# Patient Record
Sex: Female | Born: 1955 | Race: White | Hispanic: No | Marital: Married | Smoking: Never smoker
Health system: Southern US, Community
[De-identification: ages and names within clinical notes are randomized; demographics above are authoritative.]

## PROBLEM LIST (undated history)

## (undated) DIAGNOSIS — I82409 Acute embolism and thrombosis of unspecified deep veins of unspecified lower extremity: Secondary | ICD-10-CM

## (undated) DIAGNOSIS — Z794 Long term (current) use of insulin: Secondary | ICD-10-CM

## (undated) DIAGNOSIS — F410 Panic disorder [episodic paroxysmal anxiety] without agoraphobia: Secondary | ICD-10-CM

## (undated) DIAGNOSIS — E119 Type 2 diabetes mellitus without complications: Secondary | ICD-10-CM

## (undated) DIAGNOSIS — L719 Rosacea, unspecified: Secondary | ICD-10-CM

## (undated) DIAGNOSIS — N200 Calculus of kidney: Secondary | ICD-10-CM

## (undated) DIAGNOSIS — F419 Anxiety disorder, unspecified: Secondary | ICD-10-CM

## (undated) DIAGNOSIS — D1803 Hemangioma of intra-abdominal structures: Secondary | ICD-10-CM

## (undated) DIAGNOSIS — M1990 Unspecified osteoarthritis, unspecified site: Secondary | ICD-10-CM

## (undated) DIAGNOSIS — K219 Gastro-esophageal reflux disease without esophagitis: Secondary | ICD-10-CM

## (undated) DIAGNOSIS — E039 Hypothyroidism, unspecified: Secondary | ICD-10-CM

## (undated) DIAGNOSIS — J45909 Unspecified asthma, uncomplicated: Secondary | ICD-10-CM

## (undated) DIAGNOSIS — E785 Hyperlipidemia, unspecified: Secondary | ICD-10-CM

## (undated) DIAGNOSIS — I10 Essential (primary) hypertension: Secondary | ICD-10-CM

## (undated) DIAGNOSIS — Z8781 Personal history of (healed) traumatic fracture: Secondary | ICD-10-CM

## (undated) DIAGNOSIS — E781 Pure hyperglyceridemia: Secondary | ICD-10-CM

## (undated) HISTORY — DX: Hyperlipidemia, unspecified: E78.5

## (undated) HISTORY — DX: Hemangioma of intra-abdominal structures: D18.03

## (undated) HISTORY — DX: Calculus of kidney: N20.0

## (undated) HISTORY — DX: Panic disorder (episodic paroxysmal anxiety): F41.0

## (undated) HISTORY — DX: Personal history of (healed) traumatic fracture: Z87.81

## (undated) HISTORY — PX: KIDNEY STONE SURGERY: SHX686

## (undated) HISTORY — DX: Rosacea, unspecified: L71.9

## (undated) HISTORY — DX: Anxiety disorder, unspecified: F41.9

## (undated) HISTORY — DX: Type 2 diabetes mellitus without complications: E11.9

## (undated) HISTORY — DX: Hypothyroidism, unspecified: E03.9

## (undated) HISTORY — DX: Long term (current) use of insulin: Z79.4

## (undated) HISTORY — DX: Unspecified asthma, uncomplicated: J45.909

## (undated) HISTORY — DX: Unspecified osteoarthritis, unspecified site: M19.90

## (undated) HISTORY — DX: Essential (primary) hypertension: I10

## (undated) HISTORY — DX: Gastro-esophageal reflux disease without esophagitis: K21.9

## (undated) HISTORY — DX: Acute embolism and thrombosis of unspecified deep veins of unspecified lower extremity: I82.409

## (undated) HISTORY — DX: Pure hyperglyceridemia: E78.1

## (undated) HISTORY — PX: CATARACT EXTRACTION: SUR2

---

## 1978-04-17 DIAGNOSIS — I82409 Acute embolism and thrombosis of unspecified deep veins of unspecified lower extremity: Secondary | ICD-10-CM

## 1978-04-17 HISTORY — DX: Acute embolism and thrombosis of unspecified deep veins of unspecified lower extremity: I82.409

## 1992-04-17 HISTORY — PX: THYROIDECTOMY, PARTIAL: SHX18

## 1994-04-17 HISTORY — PX: THYROIDECTOMY, PARTIAL: SHX18

## 1996-04-17 HISTORY — PX: COMBINED HYSTEROSCOPY DIAGNOSTIC / D&C: SUR297

## 1997-04-17 HISTORY — PX: INCONTINENCE SURGERY: SHX676

## 1997-09-25 ENCOUNTER — Ambulatory Visit (HOSPITAL_COMMUNITY): Admission: RE | Admit: 1997-09-25 | Discharge: 1997-09-26 | Payer: Self-pay | Admitting: General Surgery

## 1997-10-09 ENCOUNTER — Ambulatory Visit (HOSPITAL_COMMUNITY): Admission: RE | Admit: 1997-10-09 | Discharge: 1997-10-09 | Payer: Self-pay | Admitting: General Surgery

## 1997-11-15 HISTORY — PX: BREAST DUCTAL SYSTEM EXCISION: SHX5242

## 1997-12-04 ENCOUNTER — Ambulatory Visit (HOSPITAL_COMMUNITY): Admission: RE | Admit: 1997-12-04 | Discharge: 1997-12-04 | Payer: Self-pay | Admitting: *Deleted

## 2000-04-17 HISTORY — PX: TOTAL ABDOMINAL HYSTERECTOMY: SHX209

## 2000-09-18 ENCOUNTER — Encounter (INDEPENDENT_AMBULATORY_CARE_PROVIDER_SITE_OTHER): Payer: Self-pay

## 2000-09-18 ENCOUNTER — Inpatient Hospital Stay (HOSPITAL_COMMUNITY): Admission: RE | Admit: 2000-09-18 | Discharge: 2000-09-20 | Payer: Self-pay | Admitting: Obstetrics and Gynecology

## 2000-11-08 ENCOUNTER — Encounter: Admission: RE | Admit: 2000-11-08 | Discharge: 2000-11-08 | Payer: Self-pay | Admitting: General Surgery

## 2000-11-08 ENCOUNTER — Encounter: Payer: Self-pay | Admitting: General Surgery

## 2001-03-17 HISTORY — PX: KNEE ARTHROSCOPY: SUR90

## 2002-03-17 HISTORY — PX: KNEE ARTHROSCOPY: SUR90

## 2002-04-16 ENCOUNTER — Encounter: Admission: RE | Admit: 2002-04-16 | Discharge: 2002-04-16 | Payer: Self-pay | Admitting: Orthopedic Surgery

## 2002-04-16 ENCOUNTER — Encounter: Payer: Self-pay | Admitting: Orthopedic Surgery

## 2002-04-18 ENCOUNTER — Ambulatory Visit (HOSPITAL_BASED_OUTPATIENT_CLINIC_OR_DEPARTMENT_OTHER): Admission: RE | Admit: 2002-04-18 | Discharge: 2002-04-18 | Payer: Self-pay | Admitting: Orthopedic Surgery

## 2002-07-07 ENCOUNTER — Emergency Department (HOSPITAL_COMMUNITY): Admission: EM | Admit: 2002-07-07 | Discharge: 2002-07-07 | Payer: Self-pay | Admitting: Emergency Medicine

## 2002-07-08 ENCOUNTER — Encounter: Payer: Self-pay | Admitting: General Surgery

## 2002-07-08 ENCOUNTER — Encounter (INDEPENDENT_AMBULATORY_CARE_PROVIDER_SITE_OTHER): Payer: Self-pay | Admitting: Specialist

## 2002-07-08 ENCOUNTER — Observation Stay (HOSPITAL_COMMUNITY): Admission: RE | Admit: 2002-07-08 | Discharge: 2002-07-09 | Payer: Self-pay | Admitting: General Surgery

## 2003-04-18 HISTORY — PX: CHOLECYSTECTOMY: SHX55

## 2004-08-23 ENCOUNTER — Ambulatory Visit (HOSPITAL_BASED_OUTPATIENT_CLINIC_OR_DEPARTMENT_OTHER): Admission: RE | Admit: 2004-08-23 | Discharge: 2004-08-23 | Payer: Self-pay | Admitting: Orthopedic Surgery

## 2007-07-19 ENCOUNTER — Ambulatory Visit: Payer: Self-pay | Admitting: Internal Medicine

## 2007-08-21 ENCOUNTER — Ambulatory Visit: Payer: Self-pay | Admitting: Internal Medicine

## 2007-08-21 ENCOUNTER — Encounter: Payer: Self-pay | Admitting: Internal Medicine

## 2007-08-29 ENCOUNTER — Telehealth: Payer: Self-pay | Admitting: Internal Medicine

## 2009-08-15 HISTORY — PX: REPLACEMENT TOTAL KNEE: SUR1224

## 2009-08-27 IMAGING — CR DG CHEST 2V
2 series · 2 of 2 positions shown · non-contrast
Comparison: None.

CLINICAL DATA: Preoperative evaluation for osteoarthritis of the
left knee.  Nonsmoker.  Hypertension and type 2 diabetes, but
controlled medically.

CHEST - 2 VIEW

[w chest pa]
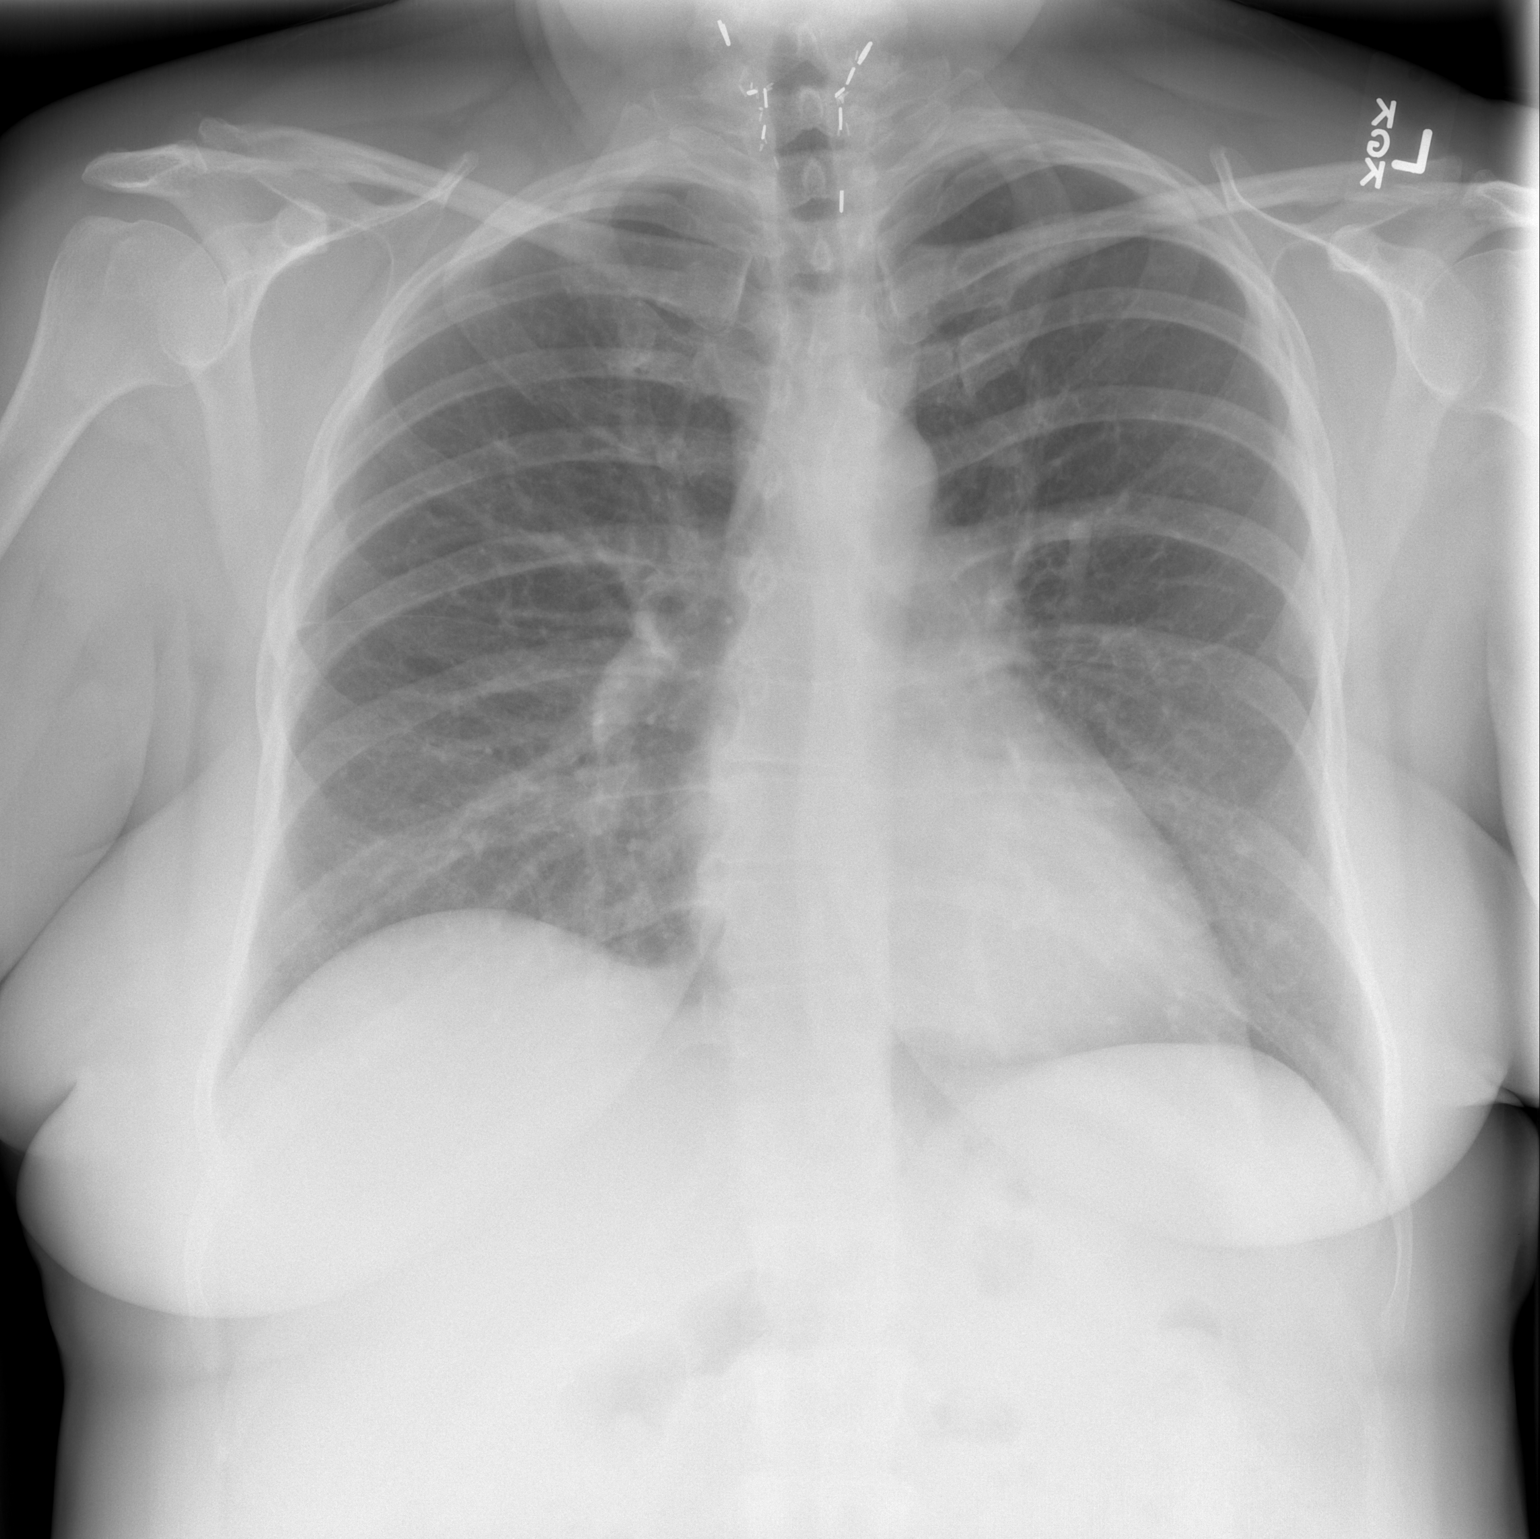

[w chest lat]
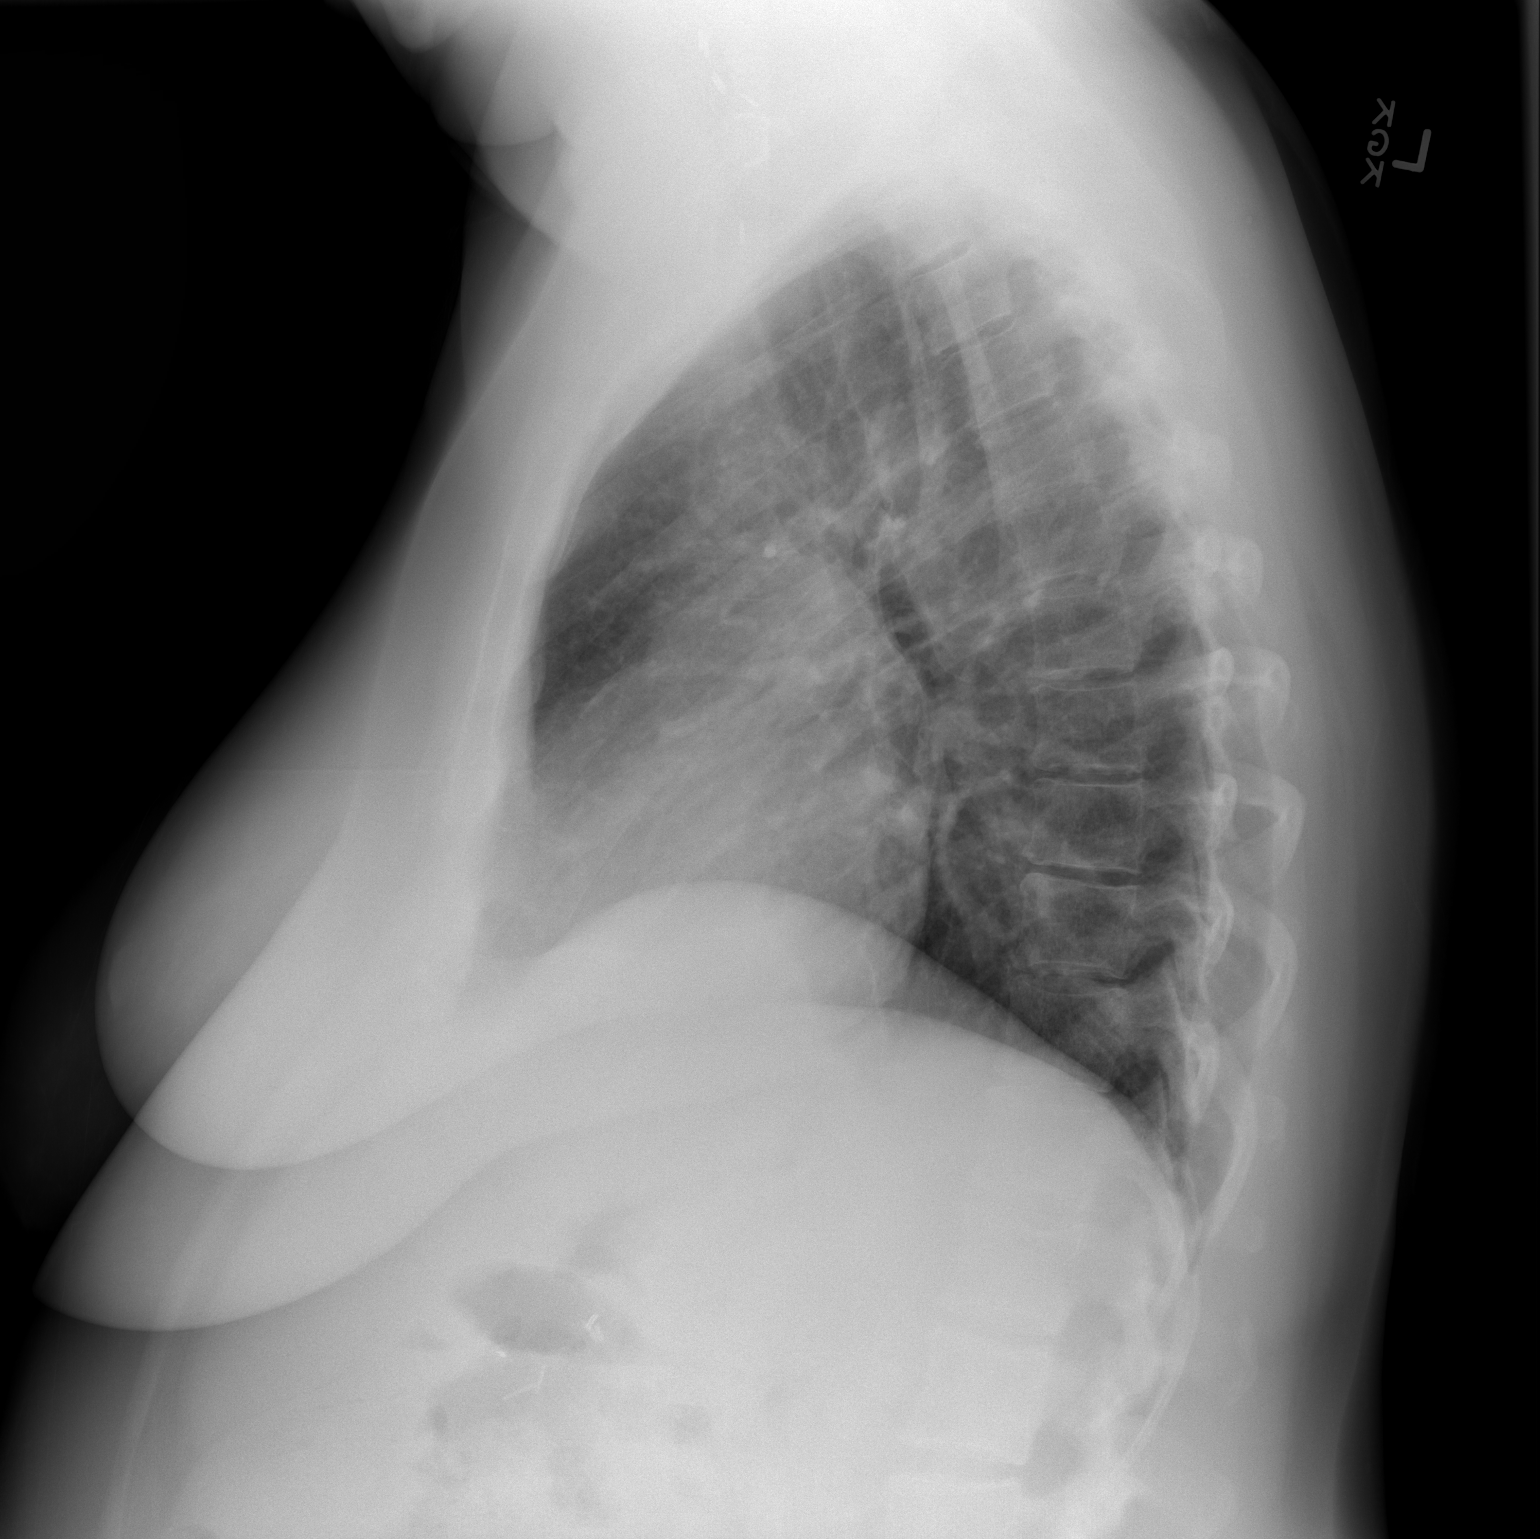

[2 of 2 positions shown; findings below may reference images not displayed]

FINDINGS: Heart and mediastinal contours are within normal limits.
The lung fields are clear with no signs of focal infiltrate or
congestive failure.  No pleural fluid or peribronchial cuffing is
seen.

Bony structures are intact.  Surgical clips are identified in the
right upper quadrant and in the anterior neck.
IMPRESSION: No acute cardiopulmonary abnormality noted.

## 2009-09-02 ENCOUNTER — Inpatient Hospital Stay (HOSPITAL_COMMUNITY): Admission: RE | Admit: 2009-09-02 | Discharge: 2009-09-04 | Payer: Self-pay | Admitting: Orthopedic Surgery

## 2010-07-04 LAB — GLUCOSE, CAPILLARY
Glucose-Capillary: 132 mg/dL — ABNORMAL HIGH (ref 70–99)
Glucose-Capillary: 187 mg/dL — ABNORMAL HIGH (ref 70–99)
Glucose-Capillary: 196 mg/dL — ABNORMAL HIGH (ref 70–99)
Glucose-Capillary: 199 mg/dL — ABNORMAL HIGH (ref 70–99)
Glucose-Capillary: 213 mg/dL — ABNORMAL HIGH (ref 70–99)
Glucose-Capillary: 231 mg/dL — ABNORMAL HIGH (ref 70–99)
Glucose-Capillary: 260 mg/dL — ABNORMAL HIGH (ref 70–99)

## 2010-07-04 LAB — BASIC METABOLIC PANEL
BUN: 11 mg/dL (ref 6–23)
BUN: 9 mg/dL (ref 6–23)
Calcium: 7.9 mg/dL — ABNORMAL LOW (ref 8.4–10.5)
Creatinine, Ser: 0.68 mg/dL (ref 0.4–1.2)
GFR calc non Af Amer: >60 mL/min (ref >60)
GFR calc non Af Amer: >60 mL/min (ref >60)
Glucose, Bld: 238 mg/dL — ABNORMAL HIGH (ref 70–99)
Potassium: 3.5 mEq/L (ref 3.5–5.1)

## 2010-07-04 LAB — CBC
HCT: 30.5 % — ABNORMAL LOW (ref 36.0–46.0)
MCV: 92.6 fL (ref 78.0–100.0)
Platelets: 184 10*3/uL (ref 150–400)
Platelets: 204 10*3/uL (ref 150–400)
RDW: 12.6 % (ref 11.5–15.5)
WBC: 9.1 10*3/uL (ref 4.0–10.5)

## 2010-07-04 LAB — CROSSMATCH
ABO/RH(D): O NEG
Antibody Screen: NEGATIVE

## 2010-07-04 LAB — ABO/RH: ABO/RH(D): O NEG

## 2010-07-05 LAB — URINALYSIS, ROUTINE W REFLEX MICROSCOPIC
Glucose, UA: NEGATIVE mg/dL
Hgb urine dipstick: NEGATIVE
Protein, ur: NEGATIVE mg/dL
Specific Gravity, Urine: 1.02 (ref 1.005–1.030)
pH: 7 (ref 5.0–8.0)

## 2010-07-05 LAB — COMPREHENSIVE METABOLIC PANEL
ALT: 33 U/L (ref 0–35)
AST: 39 U/L — ABNORMAL HIGH (ref 0–37)
Albumin: 4.1 g/dL (ref 3.5–5.2)
CO2: 23 mEq/L (ref 19–32)
Calcium: 9.1 mg/dL (ref 8.4–10.5)
Chloride: 103 mEq/L (ref 96–112)
GFR calc Af Amer: >60 mL/min (ref >60)
GFR calc non Af Amer: >60 mL/min (ref >60)
Sodium: 140 mEq/L (ref 135–145)

## 2010-07-05 LAB — CBC
Platelets: 268 10*3/uL (ref 150–400)
RBC: 4.63 MIL/uL (ref 3.87–5.11)
WBC: 8.7 10*3/uL (ref 4.0–10.5)

## 2010-07-05 LAB — PROTIME-INR: Prothrombin Time: 13.9 seconds (ref 11.6–15.2)

## 2010-07-05 LAB — DIFFERENTIAL
Eosinophils Absolute: 0.2 10*3/uL (ref 0.0–0.7)
Eosinophils Relative: 2 % (ref 0–5)
Lymphs Abs: 3.1 10*3/uL (ref 0.7–4.0)
Monocytes Absolute: 0.7 10*3/uL (ref 0.1–1.0)

## 2010-08-30 NOTE — Assessment & Plan Note (Signed)
[facility]                         GASTROENTEROLOGY OFFICE NOTE   [name], [name]                MRN:          [id number]  DATE:07/19/2007                            DOB:          [birth year]    OFFICE CONSULTATION NOTE   REASON FOR CONSULTATION:  Chronic heartburn and screening colonoscopy.   HISTORY:  This is a pleasant 55 year old white female with a history of  hypertension, type 2 diabetes mellitus, dyslipidemia, asthma, kidney  stones, panic disorder, and chronic reflux disease.  She is referred  through the courtesy of Dr. Timothy Lasso regarding reflux disease and screening  colonoscopy.  The patient reports to me a greater than 15 year history  of daily heartburn.  She has required proton pump inhibitor therapy on a  regular basis for control.  She denies dysphagia.  Her weight has been  stable.  No abdominal pain.  Her bowel habits are regular.  No melena or  hematochezia.   PAST MEDICAL HISTORY:  1. Hypertension.  2. Asthma.  3. Type 2 diabetes mellitus.  4. Dyslipidemia.  5. Kidney stones.  6. Panic disorder.   PAST SURGICAL HISTORY:  1. Status post cholecystectomy.  2. Status post hysterectomy.  3. Status post hemorrhoidectomy.  4. Status post thyroidectomy for benign nodules x2.   FAMILY HISTORY:  Mother with pancreatic cancer.  Sister with peptic  ulcer disease.  Father with prostate cancer.   SOCIAL HISTORY:  Patient is married with one son.  She lives with her  husband.  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  She does not smoke or  use alcohol.   REVIEW OF SYSTEMS:  Per diagnostic evaluation form.   PHYSICAL EXAMINATION:  A well-appearing female in no acute distress.  Blood pressure 110/76, heart rate 84, and regular.  Weight is 206.8  pounds.  She is 5 feet 6 inches in height.  HEENT:  Sclerae are anicteric.  Conjunctivae are pink.  Oral mucosa  is  intact.  There is no adenopathy.  LUNGS:  Clear.  HEART:  Regular.  ABDOMEN:  Soft without tenderness, mass, or hernia.  Good bowel sounds  heard.  EXTREMITIES:  Without edema.   IMPRESSION:  1. Chronic gastroesophageal reflux disease.  No alarm features.  2. Colon cancer screening, baseline risk.  3. Multiple medical problems, including diabetes mellitus.   RECOMMENDATIONS:  1. Colonoscopy with polypectomy if necessary to provide neoplasia      screen.  As well, upper endoscopy to rule out Barrett's esophagus.      The nature of the procedure as well as their risks, benefits and      alternatives were discussed in detail.  She understood and agreed      to proceed.  2. Continue proton pump inhibitor therapy.  3. Reflux precautions.  4. Hold oral diabetic agents the day of her examination in order to      avoid unwanted hypoglycemia.     Wilhemina Bonito. Marina Goodell, MD  Electronically Signed    JNP/MedQ  DD: 07/19/2007  DT: 07/19/2007  Job #: [id number]  cc:   Gwen Pounds, MD

## 2010-09-02 NOTE — Op Note (Signed)
[facility]  Patient:    [name], [name]                MRN: [id number] Proc. Date: 09/18/00 Adm. Date:  [id number] Attending:  Lendon Colonel                           Operative Report  PREOPERATIVE DIAGNOSIS:  Menorrhagia, persistent with anemia.  POSTOPERATIVE DIAGNOSIS: Menorrhagia, persistent with anemia.  OPERATION:  Abdominal hysterectomy.  SURGEON:  Katherine Roan, M.D.  DESCRIPTION OF PROCEDURE: The patient was placed in the lithotomy position, prepped and draped in the usual fashion.  Transverse incision was made in the abdomen and extended in layers to the peritoneal cavity which was entered vertically.  Exploration of the upper abdomen revealed a gallbladder that was nontense, appeared to have a stone in the mid portion of the gallbladder, and it appeared to be just one stone.  Both kidneys were normal.  The was no palpable periaortic adenopathy and no free fluid within the abdomen.  Both ovaries were normal.  The cornual aspects of the uterus were grasped and elevated.  The utero-ovarian ligaments and tube and round ligament were clamped and ligated in one pedicle, suture ligated x 2.  Bladder flap was created.  Uterine vessels were skeletonized and ligated with 0 chromic.  Cardinals and uterosacral ligaments were clamped and ligated with 0 chromic suture.  Angles of the vagina were entered.  The vagina was closed with horizontal mattress sutures of 0 chromic; 0 Vicryl was then used to suture the cardinal and uterosacral complex into the vaginal vault.  The retroperitoneal space was enclosed with interrupted sutures of 3-0 Vicryl.  Hemostasis was secure. Copious amounts of irrigation was used.  Parietal peritoneum was closed with 2-0 PDS and 0 Vicryl.  The subcutaneum was irrigated and found to be hemostatically secure.  Skin was closed with clips.  The incision was then infiltrated with about 20 cc of 0.5% Marcaine with  epinephrine.  [name] tolerated this procedure well and was sent to the recovery room in good condition. DD:  09/18/00 TD:  09/19/00 Job: [id number]

## 2010-09-02 NOTE — Op Note (Signed)
NAME:  EMMAJEAN, RATLEDGE                   ACCOUNT NO.:  [id number]   MEDICAL RECORD NO.:  [id number]                   PATIENT TYPE:  AMB   LOCATION:  DAY                                  FACILITY:  [facility]   PHYSICIAN:  Sharlet Salina T. Hoxworth, M.D.          DATE OF BIRTH:  [birth year]   DATE OF PROCEDURE:  07/08/2002  DATE OF DISCHARGE:                                 OPERATIVE REPORT   PREOPERATIVE DIAGNOSES:  Cholelithiasis and cholecystitis.   POSTOPERATIVE DIAGNOSES:  Cholelithiasis and cholecystitis.   PROCEDURE:  Laparoscopic cholecystectomy with intraoperative cholangiogram.   SURGEON:  Sharlet Salina T. Hoxworth, M.D.   ANESTHESIA:  General.   BRIEF HISTORY:  Lada Fulbright is a 55 year old white female who two years  ago had a brief episode of right upper quadrant abdominal pain and  ultrasound at that time showed small gallstones. She has done well since  that time but now presents with onset two days ago of right upper quadrant  abdominal pain and persistent nausea. She was evaluated by her family  physician and a HIDA scan was obtained which showed very minimal emptying of  the gallbladder. She has had mild elevated transaminases. She is felt to  have ongoing symptomatic gallstones and low grade cholecystitis and  laparoscopic cholecystectomy with cholangiogram has been recommended and  accepted. The nature of the procedure, indications, risks of bleeding,  infection, bile leak and bile duct injury were discussed and understood. She  is now brought to the operating room for this procedure.   DESCRIPTION OF PROCEDURE:  The patient was brought to the operating room,  placed in supine position in the operating table and general endotracheal  anesthesia was induced. She received preoperative antibiotics. PAS were in  place. The abdomen was sterilely prepped and draped. Local anesthesia was  used to infiltrate the trocar sites. A 1 cm incision was made at the  umbilicus,  dissection carried down to the midline fascia which was sharply  incised for 1 cm and the peritoneum entered under direct vision. Through a  mattress suture of #0 Vicryl, the Hasson trocar was placed and  pneumoperitoneum established. Under direct vision, a 10 mm trocar was placed  in the subxiphoid area and two 5 mm trocars along the right subcostal  margin. The gallbladder was visualized and was somewhat tense but not  acutely inflamed. The fundus was grasped and elevated up over the liver,  some omental adhesions were taken down off the gallbladder. There did appear  to be some fatty infiltration of the liver. The fundus was retracted  inferolaterally and fibrofatty tissue was stripped down off the neck of the  gallbladder toward the porta hepatis. The peritoneum anterior and posterior  to Calot's triangle was incised. The cystic artery was identified, dissected  free, and divided between two proximal and one distal clip. The cystic duct  was dissected over about a centimeter and the cystic duct gallbladder  junction dissected 360 degrees. When  the anatomy was clear, the cystic duct  was clipped at the gallbladder junction, operative cholangiogram obtained  through the cystic duct. This showed good filling of a normal size common  bile duct and intrahepatic ducts with free flow into the duodenum and no  filling defects. Following this, cholangiocath was removed, cystic duct  doubly clipped proximally and divided. The gallbladder was then dissected  free from its base using Cook cautery and removed through the umbilicus.  Complete hemostasis was assured. Trocar removed under direct vision, all CO2  evacuated from the peritoneal cavity. The mattress suture was  secured at the umbilicus and skin incisions were closed with interrupted  subcuticular 4-0 Monocryl and Steri-Strips. Sponge, needle and instrument  counts were correct. Dry sterile dressings were applied and the patient was  taken  to recovery in good condition.                                                Lorne Skeens. Hoxworth, M.D.    Tory Emerald  D:  07/08/2002  T:  07/08/2002  Job:  [id number]

## 2010-09-02 NOTE — Op Note (Signed)
   NAME:  [name], [name]                   ACCOUNT NO.:  [id number]   MEDICAL RECORD NO.:  [id number]                   PATIENT TYPE:  AMB   LOCATION:  DSC                                  FACILITY:  [facility]   PHYSICIAN:  John L. Rendall III, M.D.           DATE OF BIRTH:  [birth year]   DATE OF PROCEDURE:  04/18/2002  DATE OF DISCHARGE:                                 OPERATIVE REPORT   PREOPERATIVE DIAGNOSIS:  Degenerative tear, medial meniscus.   OPERATION/PROCEDURE:  Medial meniscectomy and local chondroplasty.   POSTOPERATIVE DIAGNOSIS:  Degenerative tear, medial meniscus, with medial  compartment osteoarthritis.   SURGEON:  John L. Rendall, M.D.   ANESTHESIA:  MAC.   DESCRIPTION OF PROCEDURE:  Under MAC anesthesia, the left knee was prepared  with Betadine and draped as a sterile field with a leg holder.  Standard  arthroscopic portals were made and findings of a degenerative anterior horn  of the medial meniscus and significant medial compartment osteoarthritis  were encountered.  There was a small degenerative tear of the posterior horn  of the medial meniscus as well.  Lateral compartment was intact.  Using a  combination of basket forceps and intra-articular shaver, the medial  meniscus was resected back to stable meniscal rim and the peeling and  degenerative hylan that was down to bare bone was trimmed around the edge of  the bare bone.  Using intra-articular curved Marlin shaver.  Following this,  the knee was copiously irrigated with saline, infiltrated with Marcaine with  morphine with epinephrine.  The punctures were closed with 4-0 nylon.  A  sterile compression wrap was applied and the patient returned to the  recovery room in good condition.                                               John L. Dorothyann Gibbs, M.D.    Renato Gails  D:  04/18/2002  T:  04/18/2002  Job:  [id number]

## 2010-09-02 NOTE — Discharge Summary (Signed)
[facility]  Patient:    [name], [name]                MRN: [id number] Adm. Date:  [id number] Disc. Date: [id number] Attending:  Lendon Colonel                           Discharge Summary  ADMISSION DIAGNOSES: 1. Persistent menorrhagia. 2. Uterine enlargement.  DISCHARGE DIAGNOSES: 1. Persistent menorrhagia. 2. Uterine enlargement. 3. History of anemia. 4. Endometrial polyps.  INDICATIONS FOR ADMISSION:  This is a 55 year old female, gravida 2, para 1, who presented for a hysterectomy for continued periods lasting heavy for seven days and was anemic.  She had a hysteroscopic resection of a large endometrial polyp in 1998.  She continued to have heavy periods.  Her Pap was normal.  We had discussed repeat hysteroscopy, Lupron, and other conservative measures. She opted to proceed with hysterectomy.  LABORATORY STUDIES:  The admission hemoglobin was in fact 11.1 and the MCV was 74.  Routine chemistries and cardiogram were within normal limits.  The pathology report revealed endometrial polyps, proliferative endometrium with focal atypical complex hyperplasia, squamous metaplasia, two benign leiomyomata, and the uterine weight was 155 g.  HOSPITAL COURSE:  The patient was admitted to the hospital and underwent an uneventful hysterectomy on September 18, 2000.  Her postoperative course was uncomplicated.  She remained afebrile and without complaints.  She was discharged on the second postoperative day to home and office care.  She was asked to call us for fever, bleeding, or any other difficulty.  CONDITION ON DISCHARGE:  Improved. DD:  09/27/00 TD:  09/27/00 Job: [id number]

## 2010-09-02 NOTE — Op Note (Signed)
[name], [name]             ACCOUNT NO.:  [id number]   MEDICAL RECORD NO.:  [id number]          PATIENT TYPE:  AMB   LOCATION:  DSC                          FACILITY:  [facility]   PHYSICIAN:  Katy Fitch. Sypher Jr., M.D.DATE OF BIRTH:  April 11, 1956   DATE OF PROCEDURE:  08/23/2004  DATE OF DISCHARGE:                                 OPERATIVE REPORT   PREOPERATIVE DIAGNOSES:  Stage III paronychia, left index finger nail fold  with abscess deep to dorsal nail fold and radial nail plate.   OPERATIONS:  Incision and drainage of the dorsal nail fold with removal of  radial nail plate and drainage of abscess followed by placement of a Xeroflo  drain.   OPERATING SURGEON:  Katy Fitch. Sypher, M.D.   ASSISTANT:  None.   ANESTHESIA:  2% lidocaine and 0.25% Marcaine metacarpal head level block of  left index finger without supplemental sedation. This was performed  in the  minor operating room.   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   She has type 2 diabetes and began to experience throbbing pain in her finger  four days prior.   She sought a consultation with internal medicine physician was unable to be  seen in the office. Therefore, she sought a dermatology consult.  Dermatologist recognized that she had an abscess and made a referral for  urgent hand surgery consult.   Her past medical history was reviewed. She is an established patient with  our practice. She was noted to have type 2 diabetes treated with oral  agents.   On exam at this time she was noted of stage III paronychia with pus deep to  the radial nail fold and dorsal nail fold.   Arrangements were made for immediate transfer to the [facility] outpatient  surgical center for incision and drainage in the minor operating room.   Preoperatively questions were invited and answered regarding the  procedure.   PROCEDURE:  [name] was brought to the operating room and placed in  supine position on the operating table.   Following alcohol, Betadine prep of her palm and metacarpal region of her  index finger 0.25% Marcaine and 2% lidocaine were infiltrated around the  digital nerve bundles to create a digital block.   After 10 minutes she had complete anesthesia.   Her hand was then prepped with Betadine soap and solution and sterilely  draped.   Procedure was completed without a tourniquet.   A pair of iris scissors to used to elevate the radial 20% of the nail plate.  This was removed from deep to the dorsal nail fold allowing immediate  release of the abscess contents.   This was irrigated and the dorsal nail fold probed with a blunt instrument  to be certain that all loculations were released.   The radial nail wall was then inspected carefully to be certain that was not  an extension to the pulp.   The wound was then irrigated and a  drain created from Xeroflo was placed  deep to the dorsal nail fold.   The finger was then dressed with Xeroflo, sterile gauze and a Coban dressing  based at wrist level.  There were no apparent complications.   For aftercare [name] will be taking Darvocet N 100 one p.o. q.4 to 6  h. p.r.n. pain.  She is provided 30 tablets without refill. She will also  begin doxycycline 100 milligram p.o. q.12 h. and anticipates using this  medication for 7 days.   She was counseled about potential side-effects of doxycycline including  photosensitivity and GI upset. She will drink copious amounts of water while  on this medication.   She return for follow-up in our office in 3 days for dressing change and  initiation of a soak program.      RVS/MEDQ  D:  08/23/2004  T:  08/23/2004  Job:  [id number]

## 2010-09-02 NOTE — H&P (Signed)
[facility]  Patient:    [name], [name]                      MRN: [id number] Adm. Date:  09/18/00 Attending:  Katherine Roan, M.D.                         History and Physical  CHIEF COMPLAINT:  Continued heavy periods.  HISTORY OF PRESENT ILLNESS:  [name] is a 55 year old gravida 2, para 1 female who presents for hysterectomy for continued heavy periods lasting seven days and anemia.  She had a hysteroscopy with resection of a large endometrial polyp in 1998 which did not alleviate her problems.  Pap smear normal and uterus is slightly enlarged.  Options for repeat hysteroscopy, Lupron, and hysterectomy have been discussed with the patient, and she has opted to proceed with hysterectomy.  MEDICATIONS: 1. Thyroid. 2. Nexium. 3. Elavil 25 mg at bed time. 4. A medication for hypertension called Covera 240, which I think is    verapamil.  PAST SURGICAL HISTORY:  She has a history of a partial thyroidectomy and hysteroscopy, one therapeutic AB in 1981, and a normal vaginal delivery in 1985.  PAST MEDICAL HISTORY:  Panic attacks for which she takes Xanax, and on routine evaluation she was found to have gallstones along with hemangioma of the liver.  REVIEW OF SYSTEMS:  HEENT:  She wears glasses, but notes no headaches or frequent dizziness.  No problems with decrease in visual or auditory acuity. HEART:  She has hypertension and controlled.  No shortness of breath or chest pain.  No history of mitral valve prolapse or rheumatic fever.  LUNGS:  She has asthma which is symptomatically treated and appears to be seasonal.  No hemoptysis, no shortness of breath, no chronic cough.  GU:  She has frequent urinary tract infections.  No dysuria at this time and no incontinence.  GI: She has fatty good and GERD.  She also has known gallstones.  No weight loss or gain.  MUSCLES/BONES/JOINTS:  No history of fractures or arthritis.  SOCIAL HISTORY:  She is a  Engineer, site for [employer].  FAMILY HISTORY:  Her mother is deceased with pancreatic cancer.  Father is living and had problems with the prostate and is diabetic.  She has paternal aunts and uncles who are also diabetic.  PHYSICAL EXAMINATION:  GENERAL:  Well-developed, nourished female who appears to be her stated age of 77.  She is alert and oriented to time, space, and recent events.  VITAL SIGNS:  Weight 219 pounds, blood pressure 130/76.  HEENT:  Unremarkable.  Oropharynx is not injected.  NECK:  Supple and thyroid is not enlarged.  There is a thyroidectomy scar.  No bruits are heard and no adenopathy.  LUNGS:  Clear to auscultation and percussion.  HEART:  Normal sinus rhythm, no murmurs.  BREASTS:  No masses or tenderness.  ABDOMEN:  Soft.  Liver, spleen, and kidneys are not palpated.  Bowel sounds are normal, no bruits are heard.  PELVIC:  Vulva and vagina reveals normal vulva and vagina.  Cervix is clear. Uterus is anterior, slightly enlarged with no masses.  No adnexal masses are noted.  Rectovaginal confirms.  EXTREMITIES:  Good range of motion and equal pulses and reflexes.  NEUROLOGIC:  Cranial nerves are intact.  IMPRESSION:  Uterine enlargement with heavy periods and anemia.  PLAN:  Hysterectomy with ovarian preservations.  Risks, benefits,  detailed informed consent has been given. DD:  09/18/00 TD:  09/18/00 Job: [id number]

## 2010-09-02 NOTE — Consult Note (Signed)
NAME:  [name]                   ACCOUNT NO.:  [id number]   MEDICAL RECORD NO.:  [id number]                   PATIENT TYPE:  EMS   LOCATION:  ED                                   FACILITY:  [facility]   PHYSICIAN:  Lorne Skeens. Hoxworth, M.D.          DATE OF BIRTH:  11-Oct-1955   DATE OF CONSULTATION:  07/07/2002  DATE OF DISCHARGE:                                   CONSULTATION   CHIEF COMPLAINT:  Right upper quadrant abdominal pain and nausea.   HISTORY OF PRESENT ILLNESS:  I was contacted by Dr. Marlou Starks of  [facility] today to evaluate [name].  She is seen in  the [facility] Emergency Room.  The patient has an approximately two-year  history of intermittent abdominal discomfort and known gallstones.  I  initially had seen her in July 2002, with an episode of nausea and vomiting  and minimal right upper quadrant discomfort.  Ultrasound at that time had  revealed small gallstones.  At the point I saw her she was nearly  asymptomatic, with just some mild tenderness along the rib cage felt  consistent with costochondritis.  We elected to observe her at that time,  and she has continued to do reasonably well except for some persistent  soreness along her right rib cage which has responded somewhat to anti-  inflammatories and to Lexapro.  She really has not had any deep abdominal  pain or any significant recurrent nausea or vomiting or change in bowel  habits.  She continued to get along reasonably well until yesterday when she  awoke with significant nausea without vomiting.  She also had more  significant abdominal pain just beneath the right rib cage which radiated  through to her back.  This persisted into today.  She saw Dr. Larina Bras at  [facility].  Laboratory evaluation revealed mildly elevated transaminases.  A HIDA scan was obtained which showed significantly reduced ejection  fraction of less than 10%.  On evaluation in the emergency room  this evening  she is feeling moderately better, with some low-grade nausea and very  minimal discomfort.  No fever or chills.  No jaundice noted.  Bowel  movements are normal.   PAST SURGICAL HISTORY:  1. Thyroidectomy for goiter.  2. Benign breast biopsy for nipple discharge in 1999.  3. Hysterectomy.   PAST MEDICAL HISTORY:  She is treated for:  1. Hypertension.  2. GERD.   MEDICATIONS:  1. Levoxyl 0.15 mg daily.  2. Lexapro 10 mg daily.  3. Hyzaar, dose unknown, 1 daily.  4. Nexium 40 mg daily.   ALLERGIES:  No known drug allergies.   SOCIAL HISTORY:  She is married and accompanied by her husband.  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  She does not smoke cigarettes or drink alcohol.   FAMILY HISTORY:  Significant for pancreatic cancer in her mother.   REVIEW OF SYSTEMS:  GENERAL:  No fever, chills, weight  change.  RESPIRATORY:  Denies shortness of breath or cough.  CARDIAC:  Denies chest pain other than  described above.  No palpitations, shortness of breath.  ABDOMEN:  As above.  GENITOURINARY:  No urinary burning or frequency.  HEMATOLOGIC:  She states  she has a history of phlebitis or possibly blood clot in her left leg when  she was pregnant years ago.   PHYSICAL EXAMINATION:  VITAL SIGNS:  Temperature is 99.1, pulse 69,  respirations 20, blood pressure 143/86.  GENERAL:  Mildly obese white female in no acute distress.  SKIN:  Warm and dry.  Without rash or infection.  HEENT:  No adenopathy.  Healed thyroidectomy scar.  Sclerae nonicteric.  Nares and oropharynx clear.  LUNGS:  Clear to auscultation.  CARDIAC:  Regular rhythm without murmurs.  No JVD or edema.  ABDOMEN:  Mild right upper quadrant tenderness to deep palpation.  No  palpable masses or hepatosplenomegaly.  EXTREMITIES:  No edema, deformity, or changes of chronic venous stasis.  NEUROLOGIC:  Alert and fully oriented.  Motor and sensory exams are normal.   LABORATORY DATA:  As noted above, mildly elevated  transaminases earlier  today.  I do not have a copy of this laboratory work.   HIDA scan reviewed, which showed markedly diminished ejection fraction of  less than 10%.   ASSESSMENT AND PLAN:  Episode of right upper quadrant pain and nausea, and  HIDA scan suggesting ongoing cholecystitis.  She does not appear severely  ill in the emergency room tonight.  We have elected to proceed with  laparoscopic cholecystectomy with cholangiogram tomorrow.  She will go home  tonight at her wish and come in for a.m. admission tomorrow.  Will recheck  LFTs, laboratory work preoperatively.  The nature of the procedure and risks  were discussed with the patient and her husband.                                               Lorne Skeens. Hoxworth, M.D.    Tory Emerald  D:  07/07/2002  T:  07/08/2002  Job:  [id number]   cc:   S. Kyra Manges, M.D.  [phone] [address]  [place]  [place] [address]  Fax: [phone]   Michail Sermon  [address]  [place]  [place] [address]  Fax: [phone]

## 2011-02-13 ENCOUNTER — Other Ambulatory Visit: Payer: Self-pay | Admitting: Orthopedic Surgery

## 2011-02-13 ENCOUNTER — Ambulatory Visit (HOSPITAL_COMMUNITY)
Admission: RE | Admit: 2011-02-13 | Discharge: 2011-02-13 | Disposition: A | Discharge status: Home / Self Care | Payer: BC Managed Care – PPO | Source: Ambulatory Visit | Attending: Orthopedic Surgery | Admitting: Orthopedic Surgery

## 2011-02-13 ENCOUNTER — Other Ambulatory Visit (HOSPITAL_COMMUNITY): Payer: Self-pay | Admitting: Orthopedic Surgery

## 2011-02-13 ENCOUNTER — Encounter (HOSPITAL_COMMUNITY): Payer: BC Managed Care – PPO

## 2011-02-13 DIAGNOSIS — I10 Essential (primary) hypertension: Secondary | ICD-10-CM | POA: Insufficient documentation

## 2011-02-13 DIAGNOSIS — Z01818 Encounter for other preprocedural examination: Secondary | ICD-10-CM | POA: Insufficient documentation

## 2011-02-13 DIAGNOSIS — M1990 Unspecified osteoarthritis, unspecified site: Secondary | ICD-10-CM | POA: Insufficient documentation

## 2011-02-13 DIAGNOSIS — Z01812 Encounter for preprocedural laboratory examination: Secondary | ICD-10-CM | POA: Insufficient documentation

## 2011-02-13 DIAGNOSIS — Z96659 Presence of unspecified artificial knee joint: Secondary | ICD-10-CM | POA: Insufficient documentation

## 2011-02-13 LAB — CBC
Hemoglobin: 14.2 g/dL (ref 12.0–15.0)
MCH: 30.4 pg (ref 26.0–34.0)
Platelets: 279 10*3/uL (ref 150–400)
RBC: 4.67 MIL/uL (ref 3.87–5.11)
WBC: 9.5 10*3/uL (ref 4.0–10.5)

## 2011-02-13 LAB — URINALYSIS, ROUTINE W REFLEX MICROSCOPIC
Glucose, UA: NEGATIVE mg/dL
Ketones, ur: NEGATIVE mg/dL
Leukocytes, UA: NEGATIVE
Nitrite: NEGATIVE
pH: 7.5 (ref 5.0–8.0)

## 2011-02-13 LAB — DIFFERENTIAL
Basophils Absolute: 0.1 10*3/uL (ref 0.0–0.1)
Basophils Relative: 1 % (ref 0–1)
Eosinophils Absolute: 0.1 10*3/uL (ref 0.0–0.7)
Neutro Abs: 5.3 10*3/uL (ref 1.7–7.7)
Neutrophils Relative %: 55 % (ref 43–77)

## 2011-02-13 LAB — COMPREHENSIVE METABOLIC PANEL
BUN: 14 mg/dL (ref 6–23)
CO2: 25 mEq/L (ref 19–32)
Chloride: 98 mEq/L (ref 96–112)
Creatinine, Ser: 0.47 mg/dL — ABNORMAL LOW (ref 0.50–1.10)
GFR calc non Af Amer: >90 mL/min (ref >90)
Glucose, Bld: 145 mg/dL — ABNORMAL HIGH (ref 70–99)
Total Bilirubin: 0.4 mg/dL (ref 0.3–1.2)

## 2011-02-13 LAB — PROTIME-INR
INR: 1.03 (ref 0.00–1.49)
Prothrombin Time: 13.7 seconds (ref 11.6–15.2)

## 2011-02-13 LAB — APTT: aPTT: 27 seconds (ref 24–37)

## 2011-02-13 LAB — SURGICAL PCR SCREEN
MRSA, PCR: NEGATIVE
Staphylococcus aureus: POSITIVE — AB

## 2011-02-13 IMAGING — CR DG CHEST 2V
3 series · 3 of 3 positions shown · non-contrast
Comparison: [DATE]

CLINICAL DATA: Preop.  Osteoarthritis.  Right total knee
arthroplasty.  History of hypertension.

CHEST - 2 VIEW

[w chest pa]
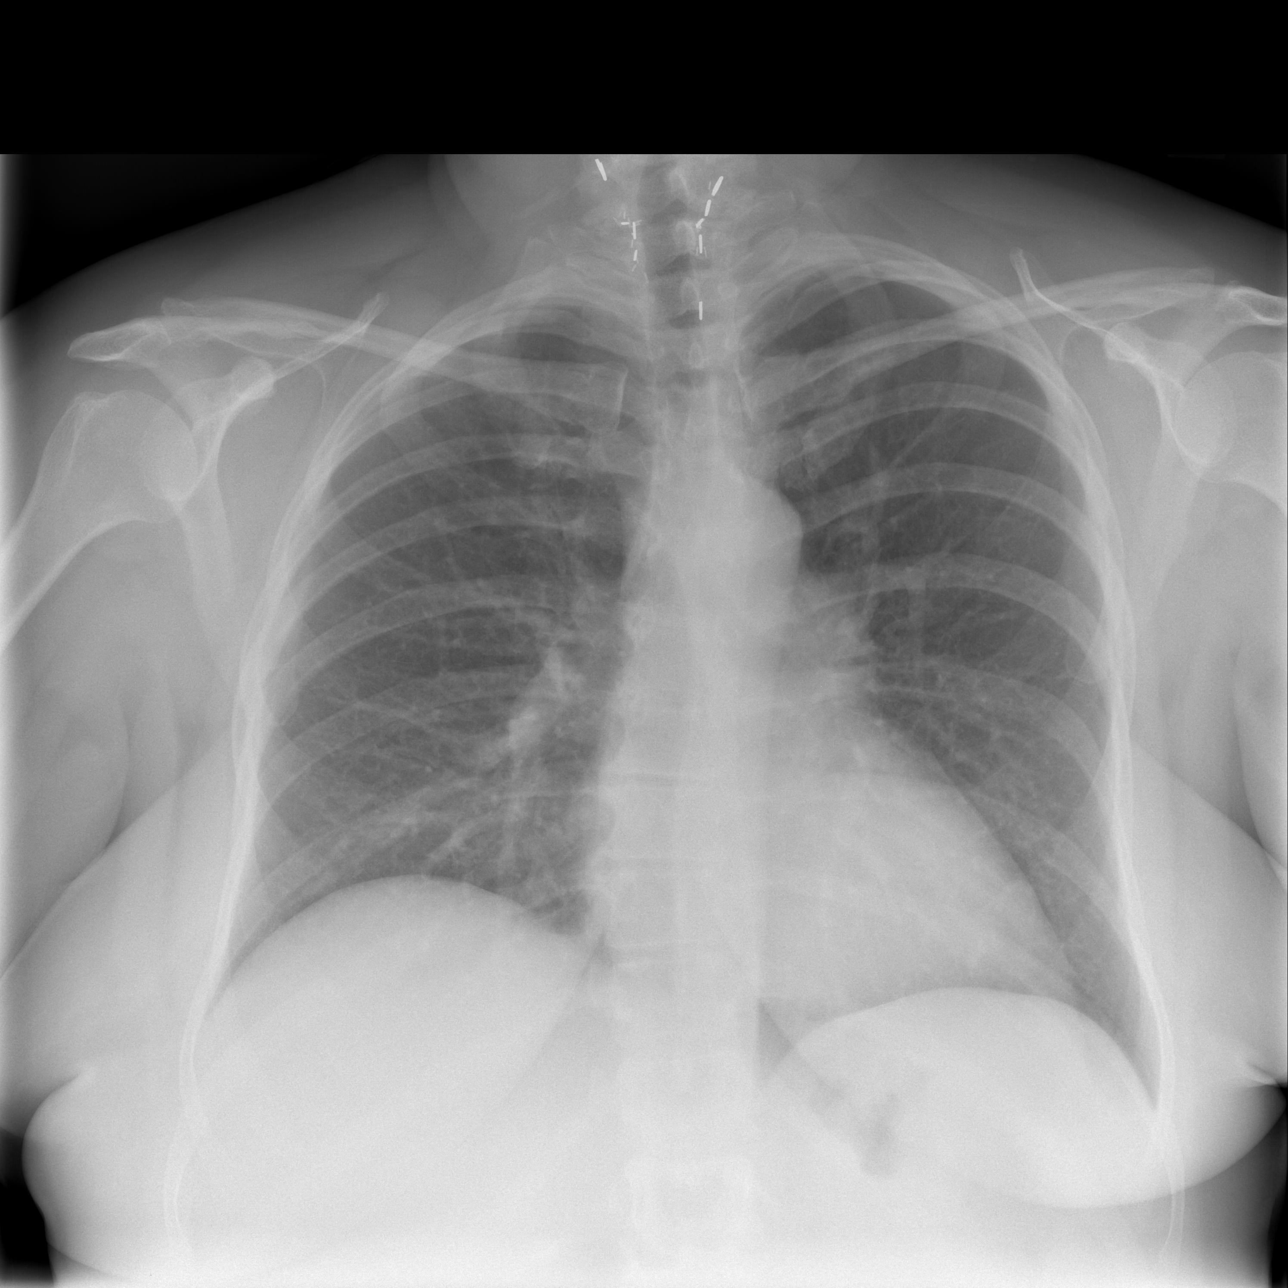

[w chest lat (1 of 2)]
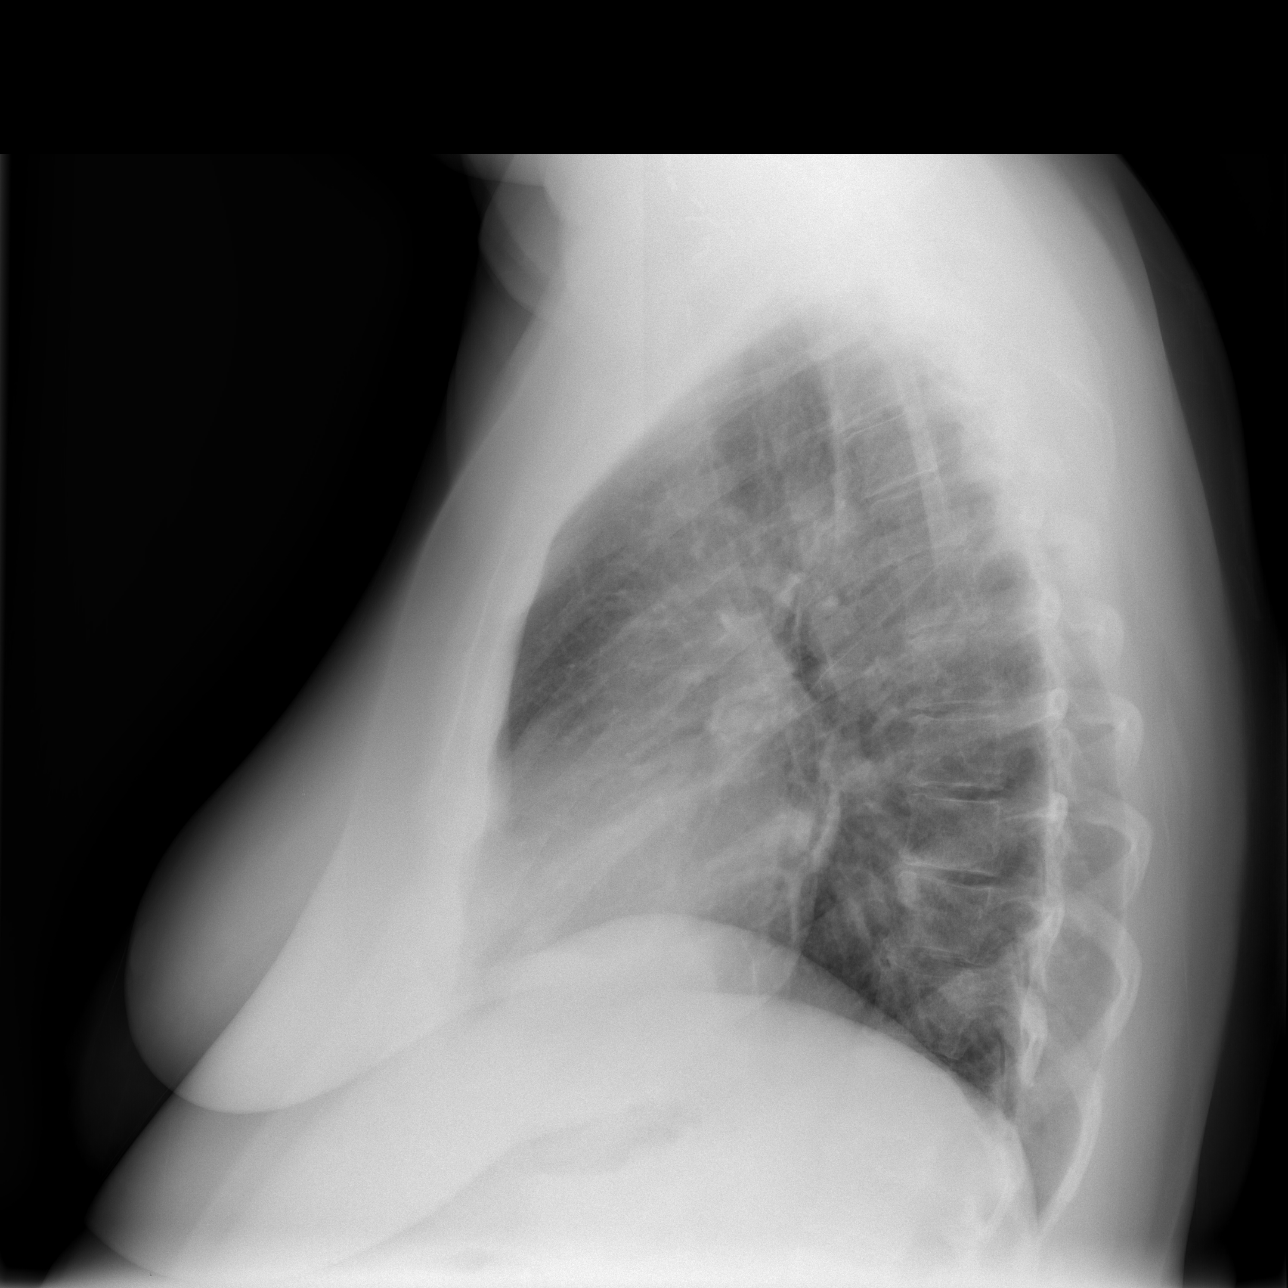

[w chest lat (2 of 2)]
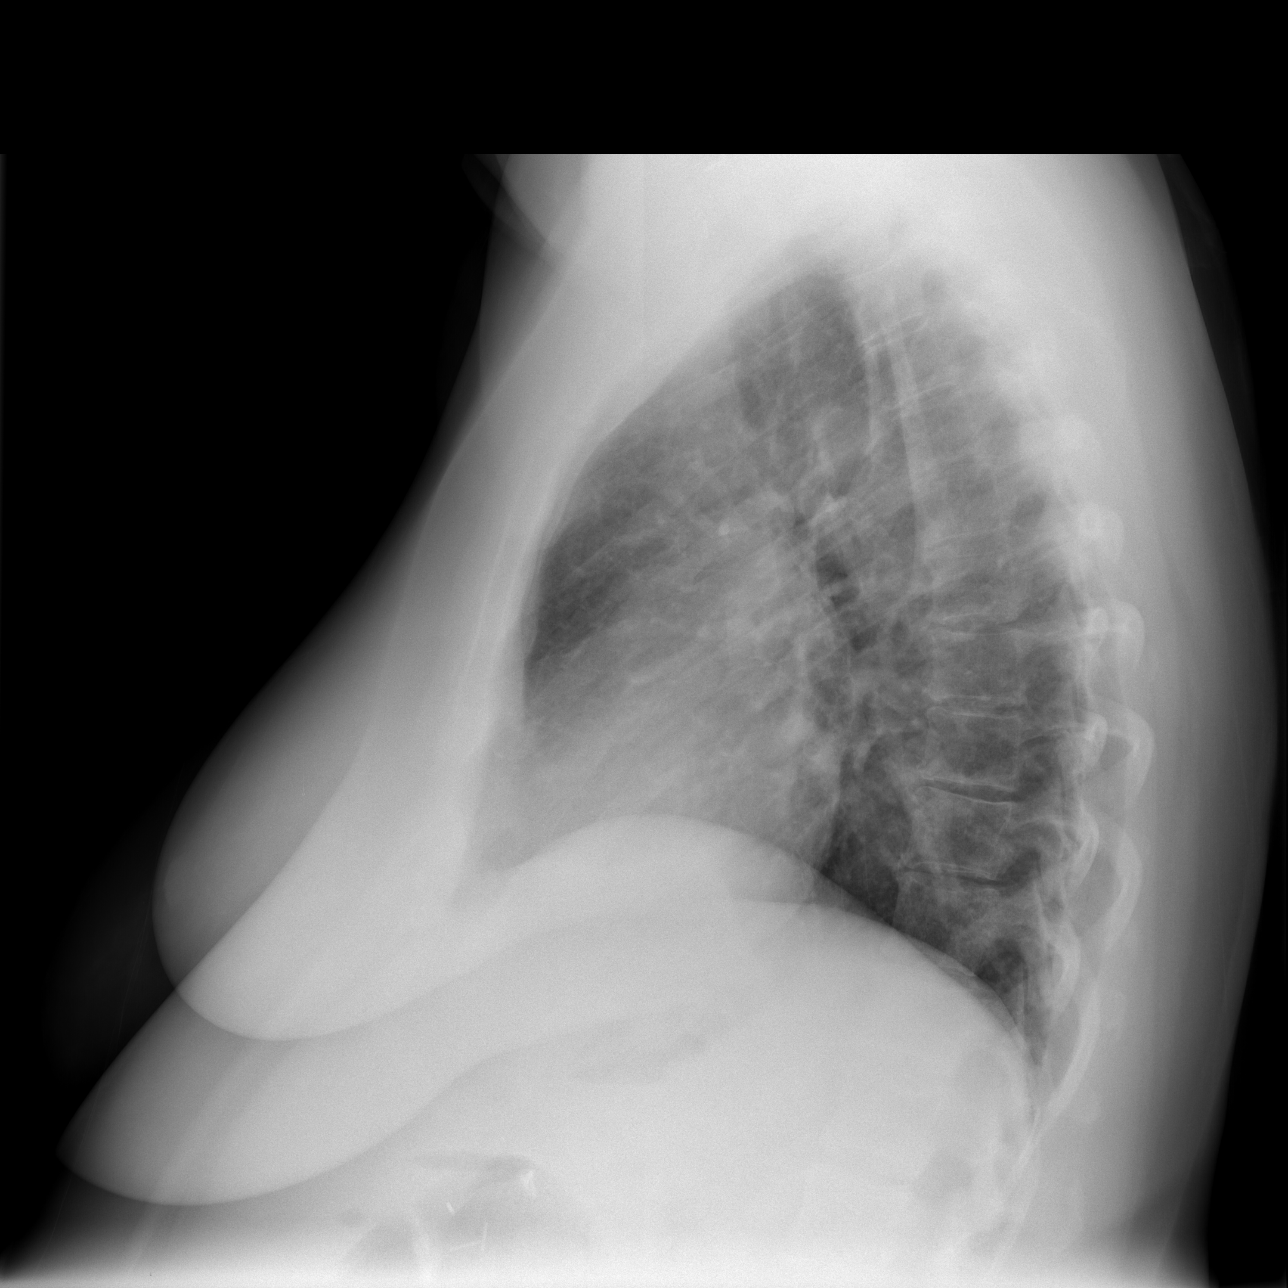

[3 of 3 positions shown; findings below may reference images not displayed]

FINDINGS: Surgical clips overlie the neck.  Heart size is upper
limits normal.  There are no focal consolidations or pleural
effusions.  No evidence for pulmonary edema.  Mild degenerative
changes are seen in the mid thoracic spine.
IMPRESSION: 1. No evidence for acute cardiopulmonary  abnormality.
2.  Postoperative changes in the anterior neck.

## 2011-02-16 ENCOUNTER — Inpatient Hospital Stay (HOSPITAL_COMMUNITY)
Admission: RE | Admit: 2011-02-16 | Discharge: 2011-02-19 | DRG: 209 | Disposition: A | Discharge status: Home Health | Payer: BC Managed Care – PPO | Source: Ambulatory Visit | Attending: Orthopedic Surgery | Admitting: Orthopedic Surgery

## 2011-02-16 DIAGNOSIS — K219 Gastro-esophageal reflux disease without esophagitis: Secondary | ICD-10-CM | POA: Diagnosis present

## 2011-02-16 DIAGNOSIS — D62 Acute posthemorrhagic anemia: Secondary | ICD-10-CM | POA: Diagnosis not present

## 2011-02-16 DIAGNOSIS — E890 Postprocedural hypothyroidism: Secondary | ICD-10-CM | POA: Diagnosis present

## 2011-02-16 DIAGNOSIS — E119 Type 2 diabetes mellitus without complications: Secondary | ICD-10-CM | POA: Diagnosis present

## 2011-02-16 DIAGNOSIS — G4700 Insomnia, unspecified: Secondary | ICD-10-CM | POA: Diagnosis present

## 2011-02-16 DIAGNOSIS — M1710 Unilateral primary osteoarthritis, unspecified knee: Principal | ICD-10-CM | POA: Diagnosis present

## 2011-02-16 DIAGNOSIS — I10 Essential (primary) hypertension: Secondary | ICD-10-CM | POA: Diagnosis present

## 2011-02-16 DIAGNOSIS — Z01812 Encounter for preprocedural laboratory examination: Secondary | ICD-10-CM

## 2011-02-16 HISTORY — PX: REPLACEMENT TOTAL KNEE: SUR1224

## 2011-02-16 LAB — BASIC METABOLIC PANEL
BUN: 17 mg/dL (ref 6–23)
CO2: 25 mEq/L (ref 19–32)
Chloride: 99 mEq/L (ref 96–112)
Creatinine, Ser: 0.46 mg/dL — ABNORMAL LOW (ref 0.50–1.10)

## 2011-02-16 LAB — GLUCOSE, CAPILLARY
Glucose-Capillary: 131 mg/dL — ABNORMAL HIGH (ref 70–99)
Glucose-Capillary: 398 mg/dL — ABNORMAL HIGH (ref 70–99)

## 2011-02-16 LAB — TYPE AND SCREEN

## 2011-02-17 LAB — GLUCOSE, CAPILLARY
Glucose-Capillary: 156 mg/dL — ABNORMAL HIGH (ref 70–99)
Glucose-Capillary: 141 mg/dL — ABNORMAL HIGH (ref 70–99)
Glucose-Capillary: 188 mg/dL — ABNORMAL HIGH (ref 70–99)

## 2011-02-17 LAB — BASIC METABOLIC PANEL
CO2: 27 mEq/L (ref 19–32)
Calcium: 8.2 mg/dL — ABNORMAL LOW (ref 8.4–10.5)
Chloride: 98 mEq/L (ref 96–112)
Glucose, Bld: 258 mg/dL — ABNORMAL HIGH (ref 70–99)
Potassium: 3.8 mEq/L (ref 3.5–5.1)
Sodium: 135 mEq/L (ref 135–145)

## 2011-02-17 LAB — HEMOGLOBIN A1C
Hgb A1c MFr Bld: 8.1 % — ABNORMAL HIGH (ref <5.7)
Mean Plasma Glucose: 186 mg/dL — ABNORMAL HIGH (ref <117)

## 2011-02-17 LAB — CBC
Hemoglobin: 10.7 g/dL — ABNORMAL LOW (ref 12.0–15.0)
MCH: 30.1 pg (ref 26.0–34.0)
RBC: 3.56 MIL/uL — ABNORMAL LOW (ref 3.87–5.11)

## 2011-02-18 LAB — BASIC METABOLIC PANEL
Calcium: 8.3 mg/dL — ABNORMAL LOW (ref 8.4–10.5)
Creatinine, Ser: 0.48 mg/dL — ABNORMAL LOW (ref 0.50–1.10)
GFR calc Af Amer: >90 mL/min (ref >90)
GFR calc non Af Amer: >90 mL/min (ref >90)

## 2011-02-18 LAB — GLUCOSE, CAPILLARY
Glucose-Capillary: 153 mg/dL — ABNORMAL HIGH (ref 70–99)
Glucose-Capillary: 246 mg/dL — ABNORMAL HIGH (ref 70–99)
Glucose-Capillary: 179 mg/dL — ABNORMAL HIGH (ref 70–99)

## 2011-02-18 MED ORDER — DOCUSATE SODIUM 100 MG PO CAPS
100.0000 mg | ORAL_CAPSULE | Freq: Two times a day (BID) | ORAL | Status: DC
  Filled 2011-02-18 (×4): qty 1

## 2011-02-18 MED ORDER — BISACODYL 5 MG PO TBEC: 10.0000 mg | DELAYED_RELEASE_TABLET | Freq: Every day | ORAL | Status: DC | PRN

## 2011-02-18 MED ORDER — ONDANSETRON HCL 4 MG/2ML IJ SOLN: 4.0000 mg | Freq: Four times a day (QID) | INTRAMUSCULAR | Status: DC | PRN

## 2011-02-18 MED ORDER — ALUMINUM HYDROXIDE GEL 320 MG/5ML PO SUSP
15.0000 mL | ORAL | Status: DC | PRN
  Filled 2011-02-18: qty 30

## 2011-02-18 MED ORDER — TEMAZEPAM 15 MG PO CAPS: 15.0000 mg | ORAL_CAPSULE | Freq: Every evening | ORAL | Status: DC | PRN

## 2011-02-18 MED ORDER — OMEGA-3-ACID ETHYL ESTERS 1 G PO CAPS
1.0000 g | ORAL_CAPSULE | Freq: Every day | ORAL | Status: DC
  Filled 2011-02-18 (×2): qty 1

## 2011-02-18 MED ORDER — METFORMIN HCL 500 MG PO TABS
1000.0000 mg | ORAL_TABLET | ORAL | Status: DC
  Administered 2011-02-19: 1000 mg via ORAL
  Filled 2011-02-18 (×2): qty 2

## 2011-02-18 MED ORDER — ALUM & MAG HYDROXIDE-SIMETH 200-200-20 MG/5ML PO SUSP: 30.0000 mL | ORAL | Status: DC | PRN

## 2011-02-18 MED ORDER — PANTOPRAZOLE SODIUM 40 MG PO TBEC
40.0000 mg | DELAYED_RELEASE_TABLET | ORAL | Status: DC
  Administered 2011-02-19: 40 mg via ORAL

## 2011-02-18 MED ORDER — DULOXETINE HCL 60 MG PO CPEP
60.0000 mg | ORAL_CAPSULE | Freq: Every day | ORAL | Status: DC
  Filled 2011-02-18 (×2): qty 1

## 2011-02-18 MED ORDER — INSULIN GLARGINE 100 UNIT/ML ~~LOC~~ SOLN
20.0000 [IU] | SUBCUTANEOUS | Status: DC
  Administered 2011-02-19: 20 [IU] via SUBCUTANEOUS

## 2011-02-18 MED ORDER — INSULIN GLARGINE 100 UNIT/ML ~~LOC~~ SOLN: 50.0000 [IU] | Freq: Every day | SUBCUTANEOUS | Status: DC

## 2011-02-18 MED ORDER — ACETAMINOPHEN 650 MG RE SUPP: 325.0000 mg | RECTAL | Status: DC | PRN

## 2011-02-18 MED ORDER — SODIUM CHLORIDE 0.9 % IV BOLUS (SEPSIS): 250.0000 mL | INTRAVENOUS | Status: DC | PRN

## 2011-02-18 MED ORDER — FENOFIBRATE 160 MG PO TABS
160.0000 mg | ORAL_TABLET | Freq: Every day | ORAL | Status: DC
  Filled 2011-02-18 (×2): qty 1

## 2011-02-18 MED ORDER — RIVAROXABAN 10 MG PO TABS
10.0000 mg | ORAL_TABLET | ORAL | Status: DC
  Filled 2011-02-18: qty 1

## 2011-02-18 MED ORDER — INSULIN ASPART 100 UNIT/ML ~~LOC~~ SOLN
0.0000 [IU] | Freq: Every day | SUBCUTANEOUS | Status: DC
  Filled 2011-02-18: qty 3

## 2011-02-18 MED ORDER — FLEET ENEMA 7-19 GM/118ML RE ENEM: 1.0000 | ENEMA | Freq: Every day | RECTAL | Status: DC | PRN

## 2011-02-18 MED ORDER — GLIMEPIRIDE 4 MG PO TABS
4.0000 mg | ORAL_TABLET | ORAL | Status: DC
  Administered 2011-02-19: 4 mg via ORAL
  Filled 2011-02-18 (×3): qty 1

## 2011-02-18 MED ORDER — SODIUM CHLORIDE 0.9 % IJ SOLN: 3.0000 mL | Freq: Two times a day (BID) | INTRAMUSCULAR | Status: DC

## 2011-02-18 MED ORDER — LOSARTAN POTASSIUM 50 MG PO TABS
100.0000 mg | ORAL_TABLET | ORAL | Status: DC
  Administered 2011-02-19: 100 mg via ORAL
  Filled 2011-02-18 (×2): qty 2

## 2011-02-18 MED ORDER — HYDROCHLOROTHIAZIDE 25 MG PO TABS
25.0000 mg | ORAL_TABLET | ORAL | Status: DC
  Administered 2011-02-19: 25 mg via ORAL
  Filled 2011-02-18 (×2): qty 1

## 2011-02-18 MED ORDER — SODIUM CHLORIDE 0.9 % IV SOLN: INTRAVENOUS | Status: DC

## 2011-02-18 MED ORDER — BISACODYL 10 MG RE SUPP: 10.0000 mg | Freq: Every day | RECTAL | Status: DC | PRN

## 2011-02-18 MED ORDER — PHENOL 1.4 % MT LIQD
1.0000 | OROMUCOSAL | Status: DC | PRN
  Filled 2011-02-18: qty 177

## 2011-02-18 MED ORDER — SENNA 8.6 MG PO TABS
1.0000 | ORAL_TABLET | Freq: Two times a day (BID) | ORAL | Status: DC
  Administered 2011-02-19: 8.6 mg via ORAL

## 2011-02-18 MED ORDER — METHOCARBAMOL 500 MG PO TABS
500.0000 mg | ORAL_TABLET | Freq: Four times a day (QID) | ORAL | Status: DC | PRN
  Administered 2011-02-19: 500 mg via ORAL

## 2011-02-18 MED ORDER — ALBUTEROL SULFATE HFA 108 (90 BASE) MCG/ACT IN AERS: 1.0000 | INHALATION_SPRAY | Freq: Four times a day (QID) | RESPIRATORY_TRACT | Status: DC | PRN

## 2011-02-18 MED ORDER — INSULIN ASPART 100 UNIT/ML ~~LOC~~ SOLN: 1.0000 [IU] | Freq: Three times a day (TID) | SUBCUTANEOUS | Status: DC

## 2011-02-18 MED ORDER — MAGIC MOUTHWASH
5.0000 mL | Freq: Four times a day (QID) | ORAL | Status: DC
  Filled 2011-02-18 (×7): qty 5

## 2011-02-18 MED ORDER — MENTHOL 3 MG MT LOZG
1.0000 | LOZENGE | OROMUCOSAL | Status: DC | PRN
  Filled 2011-02-18: qty 9

## 2011-02-18 MED ORDER — OXYCODONE-ACETAMINOPHEN 5-325 MG PO TABS
1.0000 | ORAL_TABLET | ORAL | Status: DC | PRN
  Administered 2011-02-19 (×2): 2 via ORAL

## 2011-02-18 MED ORDER — METHOCARBAMOL 100 MG/ML IJ SOLN
500.0000 mg | Freq: Four times a day (QID) | INTRAVENOUS | Status: DC | PRN
  Filled 2011-02-18: qty 10

## 2011-02-18 MED ORDER — ACETAMINOPHEN 325 MG PO TABS: 325.0000 mg | ORAL_TABLET | ORAL | Status: DC | PRN

## 2011-02-18 MED ORDER — ONDANSETRON HCL 4 MG PO TABS: 4.0000 mg | ORAL_TABLET | Freq: Four times a day (QID) | ORAL | Status: DC | PRN

## 2011-02-18 MED ORDER — LEVOTHYROXINE SODIUM 150 MCG PO TABS
150.0000 ug | ORAL_TABLET | ORAL | Status: DC
  Administered 2011-02-19: 150 ug via ORAL
  Filled 2011-02-18 (×2): qty 1

## 2011-02-18 MED ORDER — LINAGLIPTIN 5 MG PO TABS
5.0000 mg | ORAL_TABLET | Freq: Every day | ORAL | Status: DC
  Filled 2011-02-18 (×2): qty 1

## 2011-02-19 LAB — CBC
MCHC: 34.7 g/dL (ref 30.0–36.0)
Platelets: 224 10*3/uL (ref 150–400)
RDW: 13.4 % (ref 11.5–15.5)
WBC: 10.2 10*3/uL (ref 4.0–10.5)

## 2011-02-19 LAB — DIFFERENTIAL
Basophils Absolute: 0.1 10*3/uL (ref 0.0–0.1)
Basophils Relative: 1 % (ref 0–1)
Lymphocytes Relative: 32 % (ref 12–46)
Neutro Abs: 5.8 10*3/uL (ref 1.7–7.7)

## 2011-02-19 LAB — GLUCOSE, CAPILLARY: Glucose-Capillary: 160 mg/dL — ABNORMAL HIGH (ref 70–99)

## 2011-02-19 MED ORDER — BISACODYL 10 MG RE SUPP: 10.0000 mg | Freq: Every day | RECTAL | Status: DC | PRN

## 2011-02-19 MED ORDER — DOCUSATE SODIUM 100 MG PO CAPS: 100.0000 mg | ORAL_CAPSULE | Freq: Two times a day (BID) | ORAL | Status: DC

## 2011-02-19 MED ORDER — METOCLOPRAMIDE HCL 5 MG/ML IJ SOLN: 5.0000 mg | Freq: Three times a day (TID) | INTRAMUSCULAR | Status: DC | PRN

## 2011-02-19 MED ORDER — OXYCODONE-ACETAMINOPHEN 5-325 MG PO TABS: 1.0000 | ORAL_TABLET | ORAL | Status: DC | PRN

## 2011-02-19 MED ORDER — CEFAZOLIN SODIUM 1-5 GM-% IV SOLN: 1.0000 g | Freq: Four times a day (QID) | INTRAVENOUS | Status: DC

## 2011-02-19 MED ORDER — METOCLOPRAMIDE HCL 10 MG PO TABS: 5.0000 mg | ORAL_TABLET | Freq: Three times a day (TID) | ORAL | Status: DC | PRN

## 2011-02-19 MED ORDER — SENNA 8.6 MG PO TABS
ORAL_TABLET | ORAL | Status: AC
  Administered 2011-02-19: 8.6 mg via ORAL
  Filled 2011-02-19: qty 1

## 2011-02-19 MED ORDER — MAGNESIUM HYDROXIDE 400 MG/5ML PO SUSP: 30.0000 mL | Freq: Two times a day (BID) | ORAL | Status: DC | PRN

## 2011-02-19 MED ORDER — FLEET ENEMA 7-19 GM/118ML RE ENEM: 1.0000 | ENEMA | Freq: Every day | RECTAL | Status: DC | PRN

## 2011-02-19 MED ORDER — ONDANSETRON HCL 4 MG PO TABS: 4.0000 mg | ORAL_TABLET | Freq: Four times a day (QID) | ORAL | Status: DC | PRN

## 2011-02-19 MED ORDER — OXYCODONE-ACETAMINOPHEN 5-325 MG PO TABS
ORAL_TABLET | ORAL | Status: AC
  Administered 2011-02-19: 2 via ORAL
  Filled 2011-02-19: qty 2

## 2011-02-19 MED ORDER — RIVAROXABAN 10 MG PO TABS: 10.0000 mg | ORAL_TABLET | ORAL | Status: DC

## 2011-02-19 MED ORDER — METHOCARBAMOL 500 MG PO TABS
ORAL_TABLET | ORAL | Status: AC
  Administered 2011-02-19: 500 mg via ORAL
  Filled 2011-02-19: qty 1

## 2011-02-19 MED ORDER — POLYETHYLENE GLYCOL 3350 17 G PO PACK
17.0000 g | PACK | Freq: Every day | ORAL | Status: DC | PRN
  Filled 2011-02-19: qty 1

## 2011-02-19 MED ORDER — DEXTROSE 5 % IV SOLN: 500.0000 mg | Freq: Four times a day (QID) | INTRAVENOUS | Status: DC | PRN

## 2011-02-19 MED ORDER — BISACODYL 5 MG PO TBEC: 10.0000 mg | DELAYED_RELEASE_TABLET | Freq: Every day | ORAL | Status: DC | PRN

## 2011-02-19 MED ORDER — METHOCARBAMOL 500 MG PO TABS: 500.0000 mg | ORAL_TABLET | Freq: Four times a day (QID) | ORAL | Status: DC | PRN

## 2011-02-19 MED ORDER — ONDANSETRON HCL 4 MG/2ML IJ SOLN: 4.0000 mg | Freq: Four times a day (QID) | INTRAMUSCULAR | Status: DC | PRN

## 2011-02-19 NOTE — Discharge Summary (Signed)
Physician Discharge Summary  Patient ID: [name] MRN: [id number] DOB/AGE: 1956/01/11 55 y.o.  Admit date: 02/16/2011 Discharge date: 02/19/2011  Admission Diagnoses:  Discharge Diagnoses:  Active Problems:  * No active hospital problems. *    Discharged Condition: good  Hospital Course: Dx: R Knee osteoarithis, S/P R toatol knee arthrplasty  Consults: none  Significant Diagnostic Studies: none  Treatments: IV hydration, antibiotics: Ancef, anticoagulation: Xarelto, insulin: Lantus, therapies: PT and OT, procedures: none and surgery: R Total Knee Arthroploasty  Discharge Exam: Blood pressure 110/71, pulse 96, temperature 98 F (36.7 C), resp. rate 20, height 5\' 5"  (1.651 m), weight 99.56 kg (219 lb 7.8 oz), SpO2 93.00%. General appearance: alert Resp: clear to auscultation bilaterally Cardio: regular rate and rhythm, S1, S2 normal, no murmur, click, rub or gallop GI: soft, non-tender; bowel sounds normal; no masses,  no organomegaly Extremities: extremities normal, atraumatic, no cyanosis or edema and Homans sign is negative, no sign of DVT Pulses: 2+ and symmetric Skin: Skin color, texture, turgor normal. No rashes or lesions Incision/Wound:dressed  Disposition:    Current Discharge Medication List    CONTINUE these medications which have NOT CHANGED   Details  albuterol (PROVENTIL HFA;VENTOLIN HFA) 108 (90 BASE) MCG/ACT inhaler Inhale 2 puffs into the lungs every 6 (six) hours as needed. For shortness of breath     Biotin 1000 MCG tablet Take 1,000 mcg by mouth daily.      DULoxetine (CYMBALTA) 60 MG capsule Take 60 mg by mouth daily.      fenofibrate 160 MG tablet Take 160 mg by mouth at bedtime.      glimepiride (AMARYL) 4 MG tablet Take 4 mg by mouth 2 (two) times daily before lunch and supper.      ibuprofen (ADVIL,MOTRIN) 100 MG tablet Take 800 mg by mouth every 8 (eight) hours as needed. For pain     insulin glargine (LANTUS) 100 UNIT/ML injection  Inject 20-50 Units into the skin 2 (two) times daily. 20 units in the am and 50 units at night     insulin lispro (HUMALOG) 100 UNIT/ML injection Inject 5-15 Units into the skin daily before supper.      levothyroxine (SYNTHROID, LEVOTHROID) 150 MCG tablet Take 150 mcg by mouth daily.      losartan-hydrochlorothiazide (HYZAAR) 100-25 MG per tablet Take 1 tablet by mouth daily.      metFORMIN (GLUCOPHAGE) 1000 MG tablet Take 1,000 mg by mouth 2 (two) times daily before lunch and supper.      omega-3 acid ethyl esters (LOVAZA) 1 G capsule Take 2 g by mouth daily.      omeprazole (PRILOSEC) 20 MG capsule Take 20 mg by mouth every morning.      sitaGLIPtin (JANUVIA) 100 MG tablet Take 100 mg by mouth every evening.         Follow-up Information    Follow up with [facility]END[facility],JOHN L. Call in 2 weeks.   Contact information:   Tracey Harries, [facility] [address] [place] [address] [phone]          Signed: Altamese Cabal 02/19/2011, 8:14 AM

## 2011-02-19 NOTE — Progress Notes (Signed)
PATIENT ID:      JENNFER GASSEN  MRN:     [id number] DOB/AGE:    09/01/1955 / 55 y.o.    PROGRESS NOTE Subjective:  negative for Chest Pain  negative for Shortness of Breath  negative for Nausea/Vomiting   negative for Calf Pain  negative for Bowel Movement   Tolerating Diet: yes         Patient reports pain as 4 on 0-10 scale.    Objective: Vital signs in last 24 hours: Temp:  [98 F (36.7 C)-99.5 F (37.5 C)] 98 F (36.7 C) (11/04 0445) Pulse Rate:  [96-111] 96  (11/04 0445) Resp:  [18-24] 20  (11/04 0445) BP: (110-154)/(67-80) 110/71 mmHg (11/04 0445) SpO2:  [93 %-96 %] 93 % (11/04 0445)    Intake/Output from previous day: I/O last 3 completed shifts: In: 2020 [P.O.:720; I.V.:1300] Out: 7829 [FAOZH:0865; Other:1500]   Intake/Output this shift:     LABORATORY DATA: Hemoglobin  Date/Time Value Range Status  02/19/2011  5:10 AM 10.5* 12.0-15.0 (g/dL) Final     HCT  Date/Time Value Range Status  02/19/2011  5:10 AM 30.3* 36.0-46.0 (%) Final     Platelets  Date/Time Value Range Status  02/19/2011  5:10 AM 224  150-400 (K/uL) Final    Sodium  Date/Time Value Range Status  02/18/2011  4:15 AM 136  135-145 (mEq/L) Final     Potassium  Date/Time Value Range Status  02/18/2011  4:15 AM 3.4* 3.5-5.1 (mEq/L) Final     Chloride  Date/Time Value Range Status  02/18/2011  4:15 AM 98  96-112 (mEq/L) Final     CO2  Date/Time Value Range Status  02/18/2011  4:15 AM 26  19-32 (mEq/L) Final     BUN  Date/Time Value Range Status  02/18/2011  4:15 AM 8  6-23 (mg/dL) Final     Creatinine, Ser  Date/Time Value Range Status  02/18/2011  4:15 AM 0.48* 0.50-1.10 (mg/dL) Final     Glucose, Bld  Date/Time Value Range Status  02/18/2011  4:15 AM 190* 70-99 (mg/dL) Final     Calcium  Date/Time Value Range Status  02/18/2011  4:15 AM 8.3* 8.4-10.5 (mg/dL) Final   Lab Results  Component Value Date   INR 1.03 02/13/2011   INR 1.08 08/27/2009     Examination:  General appearance: alert Resp: clear to auscultation bilaterally Cardio: regular rate and rhythm, S1, S2 normal, no murmur, click, rub or gallop GI: soft, non-tender; bowel sounds normal; no masses,  no organomegaly Extremities: extremities normal, atraumatic, no cyanosis or edema and Homans sign is negative, no sign of DVT  Wound Exam: clean, dry, intact   Drainage:  None: wound tissue dry  Motor Exam EHL and FHL Intact  Sensory Exam Deep Peroneal normal  Assessment:        POD # 3 R Total Knee Arthroplasty  ADDITIONAL DIAGNOSIS:  Acute Blood Loss Anemia  Plan: Physical Therapy as ordered Weight Bearing as Tolerated (WBAT)  DVT Prophylaxis:  Xarelto  DISCHARGE PLAN: Home  DISCHARGE NEEDS: HHPT, CPM, Walker and 3-in-1 comode seat         Lakitha Gordy 02/19/2011, 7:57 AM

## 2011-02-19 NOTE — Plan of Care (Signed)
Problem: Consults Goal: Diagnosis- Total Joint Replacement Outcome: Completed in [facility] Date Met:  02/19/11 Primary Total Knee Right

## 2011-02-19 NOTE — Progress Notes (Signed)
Discharged from floor via w/c, spouse with pt. No changes in assessment.

## 2011-02-19 NOTE — Progress Notes (Signed)
Physical Therapy Treatment Patient Details Name: [name] MRN: [id number] DOB: 20-Feb-1956 Today's Date: 02/19/2011  Time: 910-9:26 Charge: Leonia Reeves  PT Assessment/Plan  PT - Assessment/Plan Comments on Treatment Session: Pt did well with treatment today and plans to D/C today.  Pt had no concerns or questions about going home. PT Plan: Discharge plan remains appropriate;Frequency remains appropriate PT Frequency: 7X/week Follow Up Recommendations: Home health PT PT Goals  Acute Rehab PT Goals PT Goal Formulation: With patient Time For Goal Achievement: 7 days Pt will go Supine/Side to Sit: with modified independence PT Goal: Supine/Side to Sit - Progress: Progressing toward goal Pt will Transfer Sit to Stand/Stand to Sit: with modified independence PT Transfer Goal: Sit to Stand/Stand to Sit - Progress: Progressing toward goal Pt will Ambulate: 51 - 150 feet;with rolling walker;with modified independence (150 feet) PT Goal: Ambulate - Progress: Progressing toward goal Pt will Go Up / Down Stairs: 3-5 stairs;with min assist;with rail(s) (5 stairs with 2 rails) PT Goal: Up/Down Stairs - Progress: Progressing toward goal  PT Treatment Precautions/Restrictions  Precautions Precautions: Knee Required Braces or Orthoses: No Restrictions Weight Bearing Restrictions: Yes RLE Weight Bearing: Weight bearing as tolerated Mobility (including Balance) Bed Mobility Bed Mobility: Yes Supine to Sit: 5: Supervision Supine to Sit Details (indicate cue type and reason): Pt uses bilateral UEs to assist with R LE off bed, increased time Transfers Transfers: Yes Sit to Stand: 5: Supervision Sit to Stand Details (indicate cue type and reason): Pt demonstrates safe technique today. Stand to Sit: 5: Supervision Stand to Sit Details: safe technique demonstrated Ambulation/Gait Ambulation/Gait: Yes Ambulation/Gait Assistance: 5: Supervision Ambulation/Gait Assistance Details (indicate cue  type and reason): verbal cues to roll RW instead of lift Ambulation Distance (Feet): 80 Feet Assistive device: Rolling walker Gait Pattern: Step-to pattern Stairs: Yes Stairs Assistance: 4: Min assist Stairs Assistance Details (indicate cue type and reason): min/guard assist, verbal cues for sequence Stair Management Technique: Two rails;Step to pattern;Forwards Number of Stairs: 3     Exercise    End of Session PT - End of Session Activity Tolerance: Patient tolerated treatment well Patient left: in bed;with call bell in reach General Behavior During Session: [facility] for tasks performed Cognition: [facility] for tasks performed  [facility] 02/19/2011, 9:47 AM

## 2011-02-21 NOTE — Op Note (Signed)
NAMEKHYLEI, WILMS             ACCOUNT NO.:  [id number]  MEDICAL RECORD NO.:  [id number]  LOCATION:  1607                         FACILITY:  [facility]  PHYSICIAN:  Maridee Slape L. Rendall, M.D.  DATE OF BIRTH:  [birth year]  DATE OF PROCEDURE:  02/16/2011 DATE OF DISCHARGE:                              OPERATIVE REPORT   PREOPERATIVE DIAGNOSIS:  Osteoarthritis, right knee.  SURGICAL PROCEDURE:  Right LCS total knee arthroplasty with computer navigation assistance.  POSTOPERATIVE DIAGNOSIS:  Osteoarthritis, right knee.  SURGEON:  Neosha Switalski L. Rendall, M.D.  ASSISTANT:  Legrand Pitts. Duffy, P.A.C. -  present and participating in entire procedure.  PATHOLOGY:  The patient has bone on bone medial compartment of the right knee.  She has already had a left total knee replacement which has been a big success with her.  She has reached the point where she has pain with weightbearing in the right knee, much worse over the last 4 to 5 months.  It has become worse to the point where she cannot get up and walk without pain.  PROCEDURE:  Under general anesthesia with a femoral nerve block, the right leg was prepared with DuraPrep and draped as a sterile field.  A sterile tourniquet was placed proximally.  The legs wrapped out with the Esmarch and the tourniquet was used at 350 mm.  A standard time-out was done.  Prophylactic antibiotics received.  Patient's knee was then opened with a midline incision.  The patella was everted.  The femur was sized as the standard.  Debridement was done in preparation for computer mapping.  Schanz pins were then placed in the superior medial tibia through accessory punctures and in the distal femur within the incision. The arrays were set up.  The femoral head and malleoli were found. Proximal tibia and distal femur were mapped within less than 1 mm of reproducible accuracy.  Then using the first guide for the tibia, proximal resection was carried out, taking 10 mm off  the high side.  The balancer was then used and knee ligaments were balanced to within 1-1/2 degree of anatomic.  The flexion gap was then measured.  Using the first femoral guide, the anterior and posterior flares of the distal femur were resected within 1 degree of anatomic rotation.  At this point, the flexion gap was measured at probably 12.5.  The distal femoral cut was then made again within 1 degree of anatomic alignment.  The extension gap was approximately 12.5.  Lamina spreader was then inserted. Remnants of the menisci and cruciates were resected.  Spurs taken off the back of the femoral condyles.  The recessing guide was then used. Proximal tibia size was #3, center peg hole with keel was inserted, trial seating then over #3 tibia, 12.5 bearing, and a standard femur reveals alignment within 1 degree of anatomic with full extension and actually some hyperextension and excellent flexion.  As the extension was measured again, it goes to 10 degrees, decision was made to go to a 15-degree bearing, and with this in place, the alignment was dead-on 0 degrees variation from anatomic and there was no hyperextension that comes right out to neutral, and the knee flexes easily  to about 120 degrees.  At this point, permanent components were obtained. Bony surfaces prepared with pulse irrigation.  All components were cemented in place.  Excess cement was removed.  The tourniquet was let down when the cement was hardened.  Multiple small vessels were cauterized.  The tourniquet time was 59 minutes.  Medium Hemovac was inserted.  Minor synovectomy was carried out.  The knee was then closed in layers with #1 Tycron, #1 Vicryl, 2-0 Vicryl, and skin clips.  Operative time, about an hour and 20-25 minutes.  Patient tolerated the procedure well and returned to recovery in excellent condition.     Savio Albrecht L. Priscille Kluver, M.D.     Renato Gails  D:  02/16/2011  T:  02/17/2011  Job:   [id number]  Electronically Signed by Erasmo Leventhal M.D. on 02/21/2011 01:31:57 PM

## 2011-05-01 ENCOUNTER — Other Ambulatory Visit: Payer: Self-pay | Admitting: Neurosurgery

## 2011-05-02 ENCOUNTER — Ambulatory Visit: Admit: 2011-05-02 | Payer: Self-pay | Admitting: Neurosurgery

## 2011-05-02 SURGERY — ANTERIOR CERVICAL DECOMPRESSION/DISCECTOMY FUSION 1 LEVEL
Anesthesia: General

## 2011-05-02 NOTE — Progress Notes (Signed)
Pts name was added to OR schedule after 6pm on  1/14.  I called Mrs. [name] , birthday 12-31-1955, foe a pre- op phone call.  Mrs [name] said:  "I don't know what you are talking about"  I confirmed her birthdate and husband's name.  I had the correct [name] on the phone.  I  Called the Dr on call for Dr Franky Macho, Dr Lovell Sheehan.  Dr Lovell Sheehan had no information about the patient.  Dr Rubye Beach said to check with the office on 1?15.  I called and spoke with Graciella Belton at Dr. Sueanne Margarita office and she said she would find out what happened.  Orders for the patient were entered at Dr. Sueanne Margarita office.

## 2011-10-18 ENCOUNTER — Other Ambulatory Visit: Payer: Self-pay | Admitting: Internal Medicine

## 2011-10-27 ENCOUNTER — Ambulatory Visit
Admission: RE | Admit: 2011-10-27 | Discharge: 2011-10-27 | Disposition: A | Discharge status: Home / Self Care | Payer: BC Managed Care – PPO | Source: Ambulatory Visit | Attending: Internal Medicine | Admitting: Internal Medicine

## 2011-10-27 IMAGING — US US ABDOMEN LIMITED
1 series · 14 of 25 positions shown · non-contrast
Comparison: None.

CLINICAL DATA: Elevated liver function tests, right upper quadrant
pain, diabetes, prior cholecystectomy

LIMITED ABDOMINAL ULTRASOUND - RIGHT UPPER QUADRANT

[Series 1: us abdomen limited · 0.24mm/px · 14 of 46 slices shown]
[im 1/46]
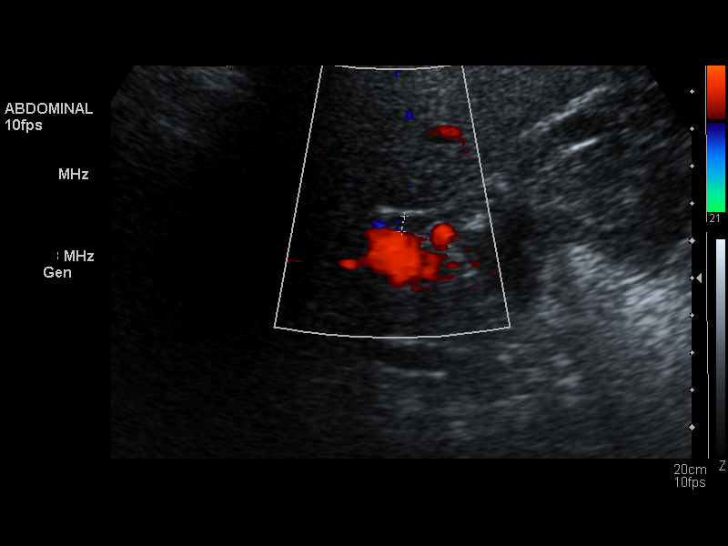
[im 4/46]
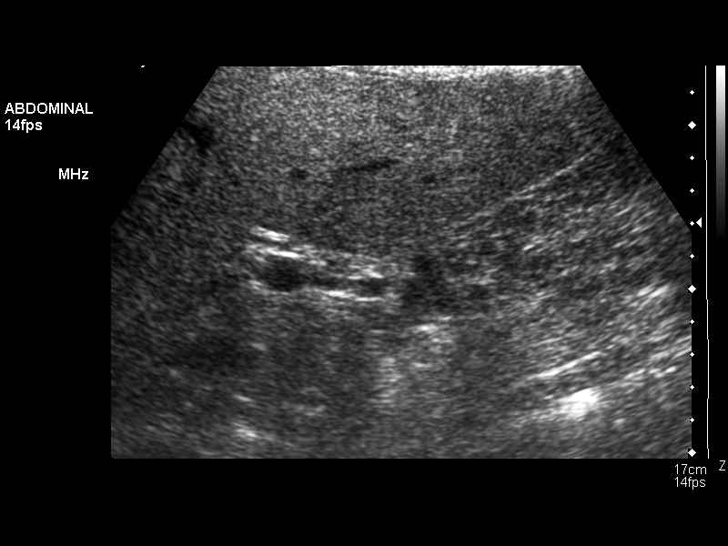
[im 8/46]
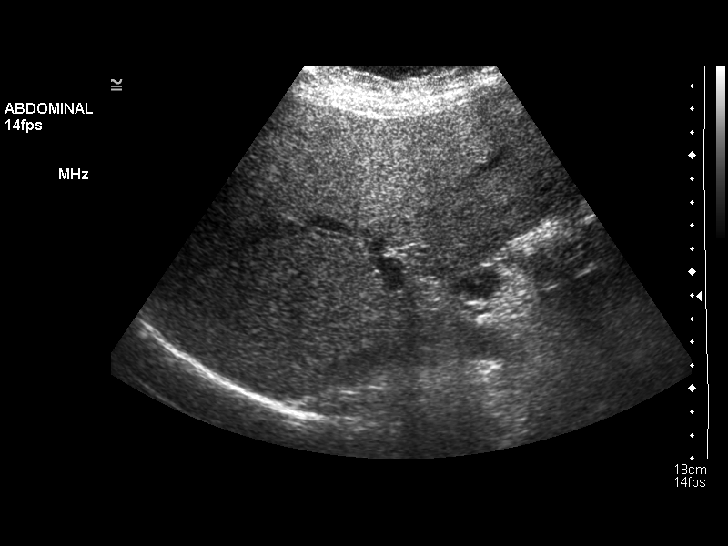
[im 12/46]
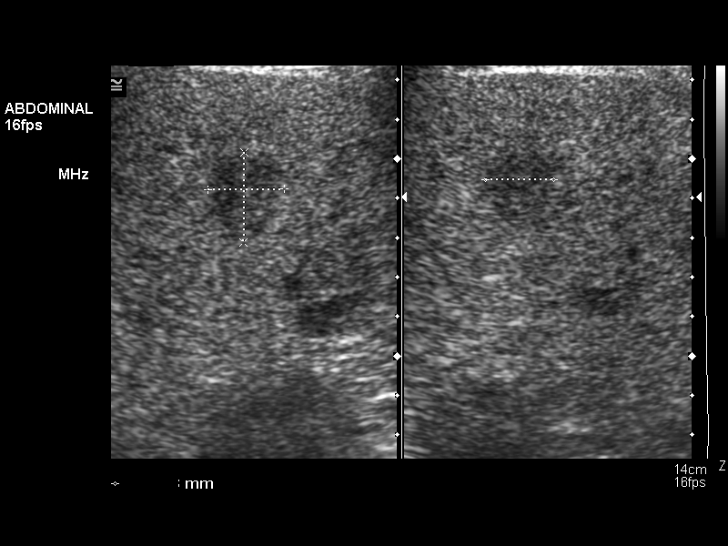
[im 16/46]
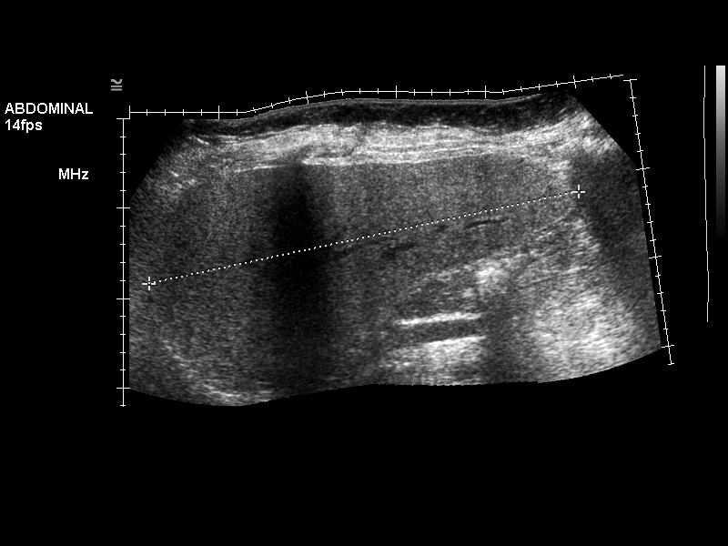
[im 17/46]
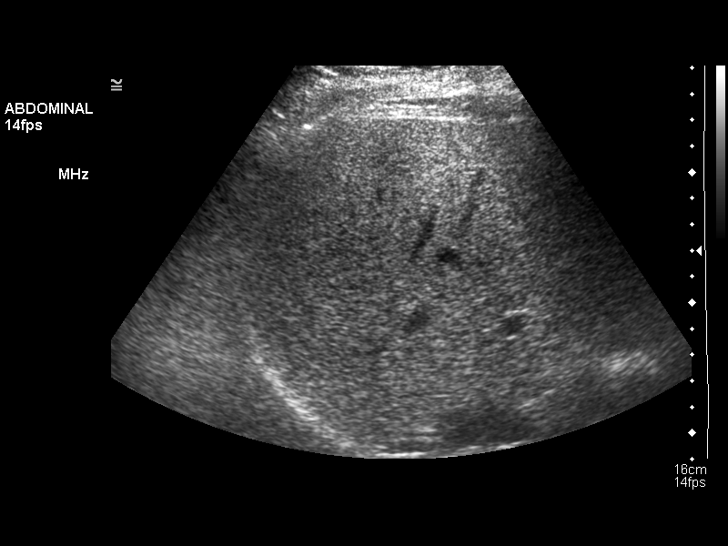
[im 21/46]
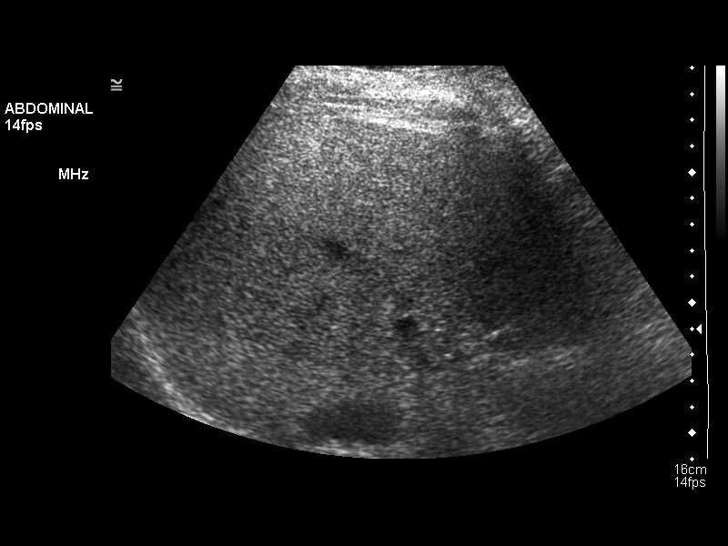
[im 25/46]
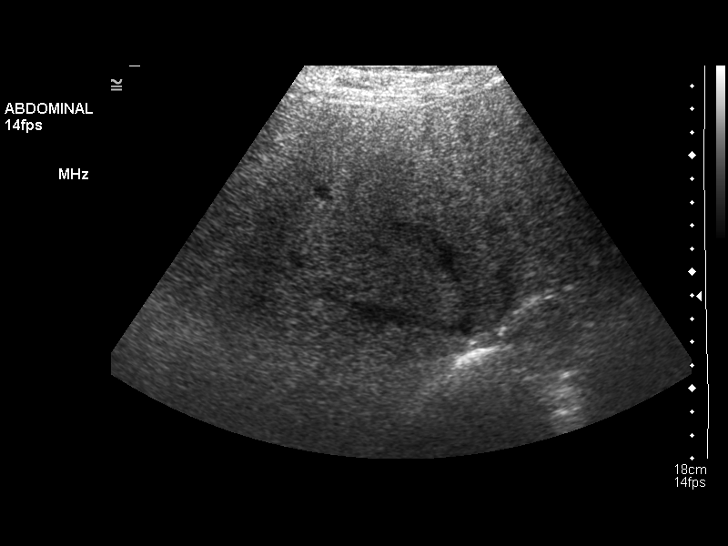
[im 29/46]
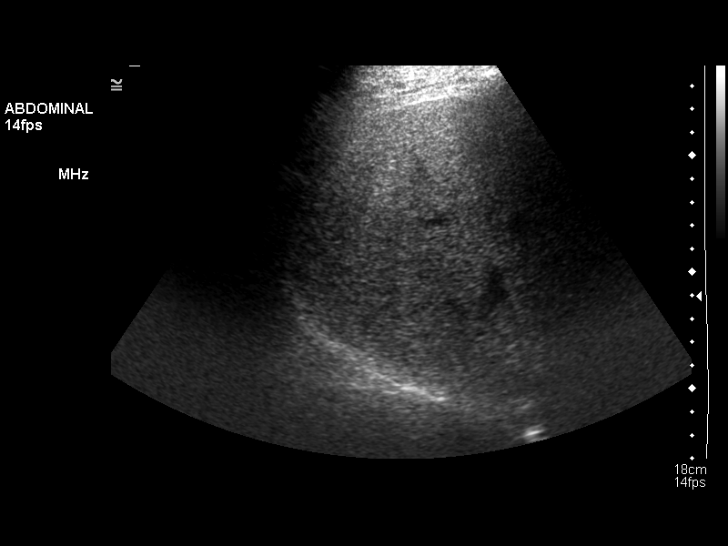
[im 31/46]
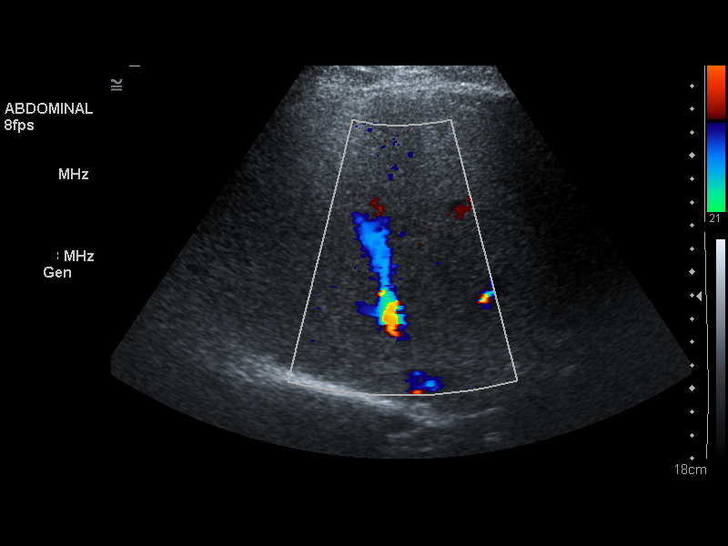
[im 34/46]
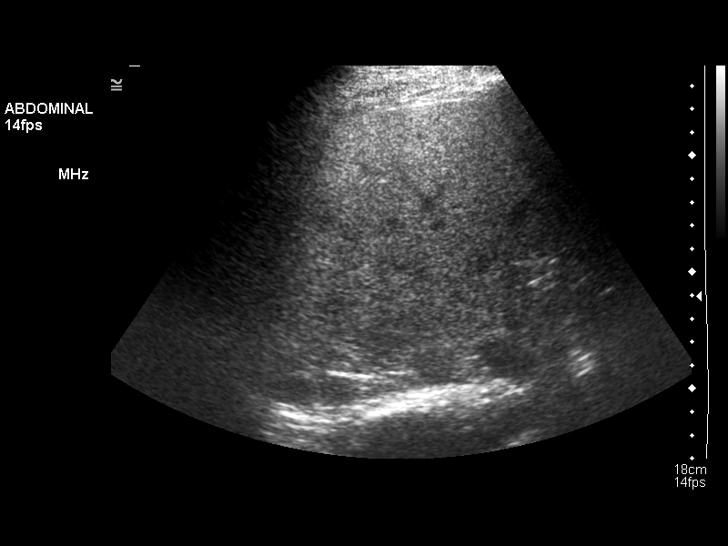
[im 38/46]
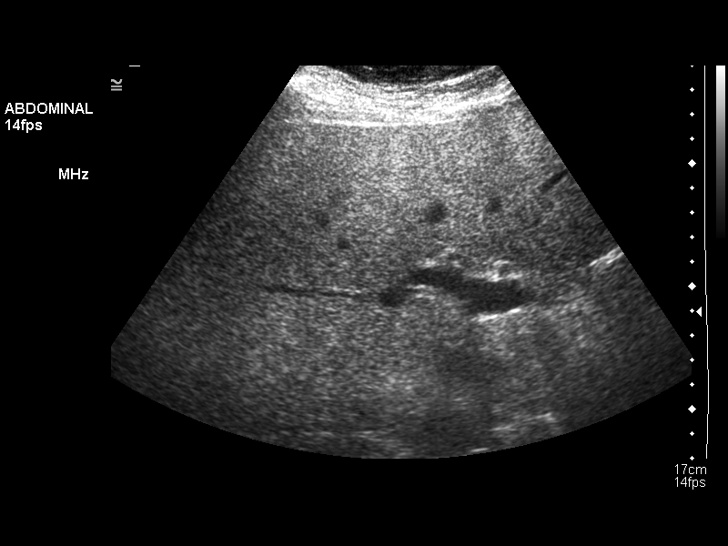
[im 42/46]
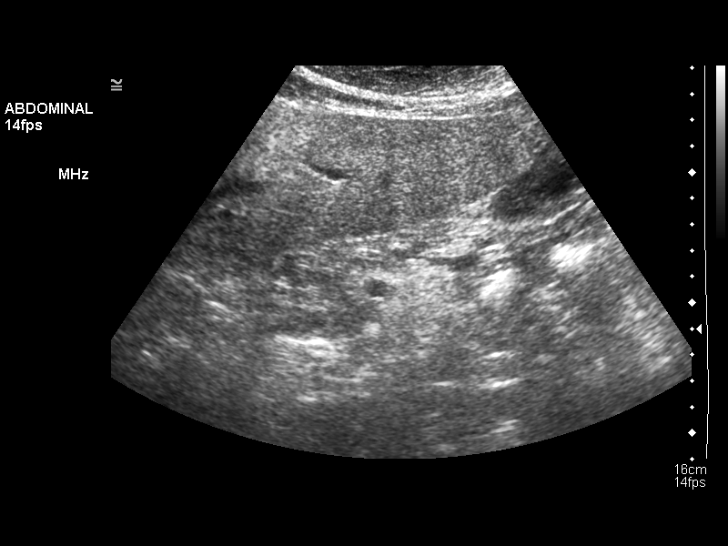
[im 46/46]
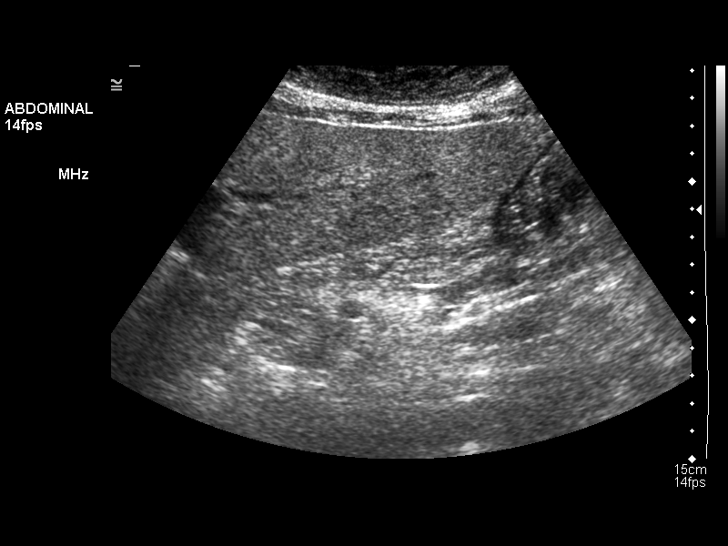

[14 of 25 positions shown; findings below may reference images not displayed]

FINDINGS: Gallbladder:  The gallbladder has previously been resected.

Common bile duct:  The common bile duct is normal measuring 4.2 mm
in diameter.

Liver:  The liver is very echogenic and inhomogeneous consistent
with fatty infiltration.  There is a rounded area in the inferior
right lobe of liver measuring 1.9 x 2.3 x 1.9 cm.  This most likely
represents the previously demonstrated atypical hemangioma by prior
ultrasound and MRI from [[place]].  At that time this area measured
x 2.5 x 2.4 cm.  Just posterior and caudal to this lesion there is
inhomogeneity most likely representing focal sparing of fatty
infiltration.  The liver is enlarged measuring 24.5 cm sagittally.
IMPRESSION: 1.  Diffuse fatty infiltration of the liver with areas of focal
sparing.
2.  Probable stable hemangioma within the inferior right lobe of
liver of 1.9 x 2.3 x 1.7 cm.
3.  Prior cholecystectomy

## 2012-02-16 HISTORY — PX: OTHER SURGICAL HISTORY: SHX169

## 2012-07-26 ENCOUNTER — Encounter: Payer: Self-pay | Admitting: Obstetrics & Gynecology

## 2012-07-29 ENCOUNTER — Ambulatory Visit (INDEPENDENT_AMBULATORY_CARE_PROVIDER_SITE_OTHER): Payer: BC Managed Care – PPO | Admitting: Obstetrics & Gynecology

## 2012-07-29 ENCOUNTER — Encounter: Payer: Self-pay | Admitting: Obstetrics & Gynecology

## 2012-07-29 VITALS — BP 132/64 | Ht 65.34 in | Wt 226.0 lb

## 2012-07-29 DIAGNOSIS — Z0000 Encounter for general adult medical examination without abnormal findings: Secondary | ICD-10-CM

## 2012-07-29 DIAGNOSIS — Z01419 Encounter for gynecological examination (general) (routine) without abnormal findings: Secondary | ICD-10-CM

## 2012-07-29 DIAGNOSIS — B373 Candidiasis of vulva and vagina: Secondary | ICD-10-CM

## 2012-07-29 LAB — POCT URINALYSIS DIPSTICK: Urobilinogen, UA: NEGATIVE

## 2012-07-29 MED ORDER — TERCONAZOLE 0.4 % VA CREA: 1.0000 | TOPICAL_CREAM | Freq: Every day | VAGINAL | Status: DC

## 2012-07-29 NOTE — Progress Notes (Signed)
57 y.o. G2P0010 MarriedCaucasianF here for annual exam.  No vaginal bleeding.  Major issue in last year was tearing her rotator cuff after a fall at work.  Repaired by Dr. Ave Filter at [facility].  Did rehab.  Doing well now.  Sees Dr. Timothy Lasso again in May.  Off Januvia.  Last HbA1C 7.8.  This is a little high for her.      Reports vulvar itching for two weeks.  Says her office is very hot--feels like about 80 degrees to her.  No odor, discharge, or VB.     Patient's last menstrual period was 04/17/2000.          Sexually active: yes  The current method of family planning is Hysterectomy.    Exercising: yes  Exercise Bike 2-3x/wk Smoker:  no  Health Maintenance: Pap:  10/13/09 MMG:  03/28/11.  Didn't do in December because of rotator cuff repair.   Colonoscopy:  2012 BMD:   2009 TDaP:  2011 with Dr. Timothy Lasso Pneumovax: 10/12 with Dr. Timothy Lasso Labs: PCP     Urine: Normal   reports that she has never smoked. She does not have any smokeless tobacco history on file. She reports that she does not drink alcohol or use illicit drugs.  Past Medical History  Diagnosis Date  . DVT (deep venous thrombosis) 1980  . Anxiety   . Panic disorder   . Hyperlipidemia   . Hypertriglyceridemia   . Diabetes mellitus   . Liver hemangioma     right lobe  . Asthma   . Nephrolithiasis   . GERD (gastroesophageal reflux disease)     Past Surgical History  Procedure Laterality Date  . Total abdominal hysterectomy  2002    ovaries remain  . Cholecystectomy  2005  . Knee arthroscopy Left 12/02  . Knee arthroscopy Right 12/03  . Breast ductal system excision  8/99  . Thyroidectomy, partial Right 1994  . Thyroidectomy, partial Left 1996  . Combined hysteroscopy diagnostic / d&c  1998  . Incontinence surgery  1999  . Replacement total knee Left 5/11  . Replacement total knee Right 11/12  . Other surgical history  11/13    Rotator Cuff Sugery    Current Outpatient Prescriptions  Medication Sig Dispense  Refill  . Biotin 1000 MCG tablet Take 1,000 mcg by mouth daily.        Marland Kitchen escitalopram (LEXAPRO) 10 MG tablet Take 10 mg by mouth daily.      . fenofibrate 160 MG tablet Take 160 mg by mouth at bedtime.        Marland Kitchen glimepiride (AMARYL) 4 MG tablet Take 4 mg by mouth 2 (two) times daily before lunch and supper.        Marland Kitchen ibuprofen (ADVIL,MOTRIN) 100 MG tablet Take 800 mg by mouth every 8 (eight) hours as needed. For pain       . insulin glargine (LANTUS) 100 UNIT/ML injection Inject 20-50 Units into the skin 2 (two) times daily. 20 units in the am and 50 units at night       . insulin lispro (HUMALOG) 100 UNIT/ML injection Inject 5-15 Units into the skin daily before supper.        . levothyroxine (SYNTHROID, LEVOTHROID) 150 MCG tablet Take 150 mcg by mouth daily.        Marland Kitchen losartan-hydrochlorothiazide (HYZAAR) 100-25 MG per tablet Take 1 tablet by mouth daily.        . metFORMIN (GLUCOPHAGE) 1000 MG tablet Take 1,000 mg  by mouth 2 (two) times daily before lunch and supper.        Marland Kitchen omega-3 acid ethyl esters (LOVAZA) 1 G capsule Take 2 g by mouth daily.        Marland Kitchen omeprazole (PRILOSEC) 20 MG capsule Take 20 mg by mouth every morning.        Marland Kitchen albuterol (PROVENTIL HFA;VENTOLIN HFA) 108 (90 BASE) MCG/ACT inhaler Inhale 2 puffs into the lungs every 6 (six) hours as needed. For shortness of breath        No current facility-administered medications for this visit.    Family History  Problem Relation Age of Onset  . Prostate cancer Father   . Pancreatic cancer Mother   . Uterine cancer Maternal Aunt     ROS:  Pertinent items are noted in HPI.  Otherwise, a comprehensive ROS was negative.  Exam:   BP 132/64  Ht 5' 5.34" (1.66 m)  Wt 226 lb (102.513 kg)  BMI 37.2 kg/m2  LMP 04/17/2000  Weight change: +10 from last yr Height:   Height: 5' 5.34" (166 cm)  Ht Readings from Last 3 Encounters:  07/29/12 5' 5.34" (1.66 m)  02/13/11 5\' 5"  (1.651 m)    General appearance: alert, cooperative and appears  stated age Head: Normocephalic, without obvious abnormality, atraumatic Neck: no adenopathy, supple, symmetrical, trachea midline and thyroid normal to inspection and palpation Lungs: clear to auscultation bilaterally Breasts: normal appearance, no masses or tenderness Heart: regular rate and rhythm Abdomen: soft, non-tender; bowel sounds normal; no masses,  no organomegaly Extremities: extremities normal, atraumatic, no cyanosis or edema Skin: Skin color, texture, turgor normal. No rashes or lesions Lymph nodes: Cervical, supraclavicular, and axillary nodes normal. No abnormal inguinal nodes palpated Neurologic: Grossly normal   Pelvic: External genitalia:  no lesions              Urethra:  normal appearing urethra with no masses, tenderness or lesions              Bartholins and Skenes: normal                 Vagina: normal appearing vagina with normal color and discharge, no lesions              Cervix: absent              Pap taken: no Bimanual Exam:  Uterus:  uterus absent              Adnexa: no mass, fullness, tenderness               Rectovaginal: Confirms               Anus:  normal sphincter tone, no lesions  Wet smear:  Ph 4.5, saline with +WBCs, neg trich, KOH with +yeast, neg whiff  A:  Well Woman with normal exam H/O TAH H/O DVT on OCPs in pt's 20s Elevated lipids/TGs/DM/asthma Topical vulvar candida today  P:  3D MMG discussed Labs with PCP Terazol 7 F/U one year or PRN  An After Visit Summary was printed and given to the patient.

## 2012-07-29 NOTE — Patient Instructions (Signed)

## 2013-09-05 ENCOUNTER — Ambulatory Visit (INDEPENDENT_AMBULATORY_CARE_PROVIDER_SITE_OTHER): Payer: BC Managed Care – PPO | Admitting: Obstetrics & Gynecology

## 2013-09-05 ENCOUNTER — Encounter: Payer: Self-pay | Admitting: Obstetrics & Gynecology

## 2013-09-05 VITALS — BP 128/58 | HR 72 | Resp 20 | Ht 65.75 in | Wt 219.0 lb

## 2013-09-05 DIAGNOSIS — Z01419 Encounter for gynecological examination (general) (routine) without abnormal findings: Secondary | ICD-10-CM

## 2013-09-05 MED ORDER — TERCONAZOLE 0.4 % VA CREA: 1.0000 | TOPICAL_CREAM | Freq: Every day | VAGINAL | Status: DC

## 2013-09-05 MED ORDER — NYSTATIN 100000 UNIT/GM EX CREA: 1.0000 "application " | TOPICAL_CREAM | Freq: Two times a day (BID) | CUTANEOUS | Status: DC

## 2013-09-05 NOTE — Patient Instructions (Signed)

## 2013-09-05 NOTE — Progress Notes (Signed)
58 y.o. G2P0010 MarriedCaucasianF here for annual exam.  No vaginal bleeding.  Did have MMG at [facility].  Has follow up due to  "abnormal" which was normal and she needed no additional testing.  Saw Dr. Virgina Jock in March.  Blood work with him was ok except for elevated BSs.  HbA1C was in 8 range.  Newly on [place].  Side effect is recurrent yeast vaginitis but she hasn't had one.  Would like Rx on hand.  Patient's last menstrual period was 04/17/2000.          Sexually active: no not in months The current method of family planning is status post hysterectomy.    Exercising: yes  exercise bike Smoker:  no  Health Maintenance: Pap:  10/13/09 WNL History of abnormal Pap:  no MMG:  Per patient at [facility] 3/15-MMG and additional views-all normal Colonoscopy:  2009-repeat in 10 years BMD:   2009-good TDaP:  2012 Screening Labs: PCP, Hb today: PCP, Urine today: PCP   reports that she has never smoked. She has never used smokeless tobacco. She reports that she does not drink alcohol or use illicit drugs.  Past Medical History  Diagnosis Date  . DVT (deep venous thrombosis) 1980  . Anxiety   . Panic disorder   . Hyperlipidemia   . Hypertriglyceridemia   . Diabetes mellitus   . Liver hemangioma     right lobe  . Asthma   . Nephrolithiasis   . GERD (gastroesophageal reflux disease)     Past Surgical History  Procedure Laterality Date  . Total abdominal hysterectomy  2002    ovaries remain  . Cholecystectomy  2005  . Knee arthroscopy Left 12/02  . Knee arthroscopy Right 12/03  . Breast ductal system excision  8/99  . Thyroidectomy, partial Right 1994  . Thyroidectomy, partial Left 1996  . Combined hysteroscopy diagnostic / d&c  1998  . Incontinence surgery  1999  . Replacement total knee Left 5/11  . Replacement total knee Right 11/12  . Other surgical history  11/13    Rotator Cuff Sugery    Current Outpatient Prescriptions  Medication Sig Dispense Refill  . albuterol (PROVENTIL  HFA;VENTOLIN HFA) 108 (90 BASE) MCG/ACT inhaler Inhale 2 puffs into the lungs every 6 (six) hours as needed. For shortness of breath       . B-D ULTRAFINE III SHORT PEN 31G X 8 MM MISC       . Biotin 1000 MCG tablet Take 1,000 mcg by mouth daily.        . Cholecalciferol (VITAMIN D PO) Take by mouth daily.      . Cyanocobalamin (VITAMIN B 12 PO) Take by mouth daily.      Marland Kitchen escitalopram (LEXAPRO) 10 MG tablet Take 10 mg by mouth daily.      . fenofibrate 160 MG tablet Take 160 mg by mouth at bedtime.        Marland Kitchen glimepiride (AMARYL) 4 MG tablet Take 4 mg by mouth 2 (two) times daily before lunch and supper.        Marland Kitchen ibuprofen (ADVIL,MOTRIN) 100 MG tablet Take 800 mg by mouth every 8 (eight) hours as needed. For pain       . insulin glargine (LANTUS) 100 UNIT/ML injection Inject 20-50 Units into the skin 2 (two) times daily. 20 units in the am and 50 units at night       . INVOKANA 100 MG TABS 100 mg daily.      Marland Kitchen  levothyroxine (SYNTHROID, LEVOTHROID) 150 MCG tablet Take 150 mcg by mouth daily.        Marland Kitchen losartan-hydrochlorothiazide (HYZAAR) 100-25 MG per tablet Take 1 tablet by mouth daily.        . metFORMIN (GLUCOPHAGE) 1000 MG tablet Take 1,000 mg by mouth 2 (two) times daily before lunch and supper.        . nystatin cream (MYCOSTATIN) Apply 1 application topically 2 (two) times daily. 100,000U/gram use BID x 7days prn itching      . omega-3 acid ethyl esters (LOVAZA) 1 G capsule Take 2 g by mouth daily.        Marland Kitchen omeprazole (PRILOSEC) 20 MG capsule Take 20 mg by mouth every morning.        . ONE TOUCH ULTRA TEST test strip       . insulin lispro (HUMALOG) 100 UNIT/ML injection Inject 5-15 Units into the skin daily before supper.         No current facility-administered medications for this visit.    Family History  Problem Relation Age of Onset  . Prostate cancer Father   . Pancreatic cancer Mother   . Uterine cancer Maternal Aunt     ROS:  Pertinent items are noted in HPI.  Otherwise, a  comprehensive ROS was negative.  Exam:   BP 128/58  Pulse 72  Resp 20  Ht 5' 5.75" (1.67 m)  Wt 219 lb (99.338 kg)  BMI 35.62 kg/m2  LMP 04/17/2000  Weight change: -2#  Height: 5' 5.75" (167 cm)  Ht Readings from Last 3 Encounters:  09/05/13 5' 5.75" (1.67 m)  07/29/12 5' 5.34" (1.66 m)  02/13/11 5\' 5"  (1.651 m)    General appearance: alert, cooperative and appears stated age Head: Normocephalic, without obvious abnormality, atraumatic Neck: no adenopathy, supple, symmetrical, trachea midline and thyroid normal to inspection and palpation Lungs: clear to auscultation bilaterally Breasts: normal appearance, no masses or tenderness Heart: regular rate and rhythm Abdomen: soft, non-tender; bowel sounds normal; no masses,  no organomegaly Extremities: extremities normal, atraumatic, no cyanosis or edema Skin: Skin color, texture, turgor normal. No rashes or lesions Lymph nodes: Cervical, supraclavicular, and axillary nodes normal. No abnormal inguinal nodes palpated Neurologic: Grossly normal   Pelvic: External genitalia:  no lesions              Urethra:  normal appearing urethra with no masses, tenderness or lesions              Bartholins and Skenes: normal                 Vagina: normal appearing vagina with normal color and discharge, no lesions              Cervix: absent              Pap taken: no Bimanual Exam:  Uterus:  uterus absent              Adnexa: normal adnexa and no mass, fullness, tenderness               Rectovaginal: Confirms               Anus:  normal sphincter tone, no lesions  A:  Well Woman with normal exam Dense breasts H/O TAH Skin candida Diabetic Elevated lipids Hypothyroidism High risk medication for yeast vaginitis  P:   Mammogram yearly.  3D discussed. pap smear not indicated Rx for terazol and for nystatin cream to  pharmacy. return annually or prn  An After Visit Summary was printed and given to the patient.

## 2014-01-27 ENCOUNTER — Encounter: Payer: Self-pay | Admitting: Internal Medicine

## 2014-02-16 ENCOUNTER — Encounter: Payer: Self-pay | Admitting: Obstetrics & Gynecology

## 2014-09-15 ENCOUNTER — Ambulatory Visit (INDEPENDENT_AMBULATORY_CARE_PROVIDER_SITE_OTHER): Payer: BC Managed Care – PPO | Admitting: Obstetrics & Gynecology

## 2014-09-15 ENCOUNTER — Encounter: Payer: Self-pay | Admitting: Obstetrics & Gynecology

## 2014-09-15 VITALS — BP 122/60 | HR 88 | Resp 20 | Ht 65.5 in | Wt 217.0 lb

## 2014-09-15 DIAGNOSIS — E038 Other specified hypothyroidism: Secondary | ICD-10-CM

## 2014-09-15 DIAGNOSIS — E119 Type 2 diabetes mellitus without complications: Secondary | ICD-10-CM | POA: Insufficient documentation

## 2014-09-15 DIAGNOSIS — E039 Hypothyroidism, unspecified: Secondary | ICD-10-CM | POA: Insufficient documentation

## 2014-09-15 DIAGNOSIS — Z794 Long term (current) use of insulin: Secondary | ICD-10-CM | POA: Insufficient documentation

## 2014-09-15 DIAGNOSIS — Z0000 Encounter for general adult medical examination without abnormal findings: Secondary | ICD-10-CM

## 2014-09-15 DIAGNOSIS — Z01419 Encounter for gynecological examination (general) (routine) without abnormal findings: Secondary | ICD-10-CM

## 2014-09-15 LAB — POCT URINALYSIS DIPSTICK
BILIRUBIN UA: NEGATIVE
Blood, UA: NEGATIVE
Glucose, UA: 1000
KETONES UA: NEGATIVE
LEUKOCYTES UA: NEGATIVE
Nitrite, UA: NEGATIVE
PROTEIN UA: NEGATIVE
Urobilinogen, UA: NEGATIVE
pH, UA: 6

## 2014-09-15 MED ORDER — FLUCONAZOLE 150 MG PO TABS: 150.0000 mg | ORAL_TABLET | Freq: Once | ORAL | Status: DC

## 2014-09-15 MED ORDER — TERCONAZOLE 0.4 % VA CREA: 1.0000 | TOPICAL_CREAM | Freq: Every day | VAGINAL | Status: DC

## 2014-09-15 NOTE — Progress Notes (Signed)
60 y.o. G2P0010 MarriedCaucasianF here for annual exam.  Doing well.  No vaginal bleeding.  Reports only issue this year is mildly elevated triglycerides.  Dr. Virgina Jock is just watching these.    Reports she saw Dr. Virgina Jock a week ago for urinary urgency.  Was tested for UTI that was negative.  Still feeling this with some vaginal itching as well.   PCP:  Dr. Virgina Jock.  Last appt was in February.   Last HbA1C was 7.8.  Patient's last menstrual period was 04/17/2000.          Sexually active: No.  The current method of family planning is status post hysterectomy.    Exercising: No.  The patient does not participate in regular exercise at present. Smoker:  no  Health Maintenance: Pap:  09/2009 normal  History of abnormal Pap:  no MMG:  06/2013 per pt - Normal. Pt has appt schedule for June 17.  Self Breast Exam: No Colonoscopy:  08/2007.  Dr. Henrene Pastor.  Normal colonoscopy.  Repeat 10 years  BMD:  2009 WNL TDaP:  2011 Screening Labs: PCP, Hb today: PCP, Urine today: Glucose 1000.   reports that she has never smoked. She has never used smokeless tobacco. She reports that she does not drink alcohol or use illicit drugs.  Past Medical History  Diagnosis Date  . DVT (deep venous thrombosis) 1980  . Anxiety   . Panic disorder   . Hyperlipidemia   . Hypertriglyceridemia   . Diabetes mellitus   . Liver hemangioma     right lobe  . Asthma   . Nephrolithiasis   . GERD (gastroesophageal reflux disease)     Past Surgical History  Procedure Laterality Date  . Total abdominal hysterectomy  2002    ovaries remain  . Cholecystectomy  2005  . Knee arthroscopy Left 12/02  . Knee arthroscopy Right 12/03  . Breast ductal system excision  8/99  . Thyroidectomy, partial Right 1994  . Thyroidectomy, partial Left 1996  . Combined hysteroscopy diagnostic / d&c  1998  . Incontinence surgery  1999  . Replacement total knee Left 5/11  . Replacement total knee Right 11/12  . Other surgical history  11/13     Rotator Cuff Sugery    Current Outpatient Prescriptions  Medication Sig Dispense Refill  . albuterol (PROVENTIL HFA;VENTOLIN HFA) 108 (90 BASE) MCG/ACT inhaler Inhale 2 puffs into the lungs every 6 (six) hours as needed. For shortness of breath     . B-D ULTRAFINE III SHORT PEN 31G X 8 MM MISC     . Biotin 1000 MCG tablet Take 1,000 mcg by mouth daily.      . Cholecalciferol (VITAMIN D PO) Take by mouth daily.    . Cyanocobalamin (VITAMIN B 12 PO) Take by mouth daily.    Marland Kitchen escitalopram (LEXAPRO) 20 MG tablet Take 20 mg by mouth daily.  5  . fenofibrate 160 MG tablet Take 160 mg by mouth at bedtime.      Marland Kitchen glimepiride (AMARYL) 4 MG tablet Take 4 mg by mouth 2 (two) times daily before lunch and supper.      Marland Kitchen ibuprofen (ADVIL,MOTRIN) 100 MG tablet Take 800 mg by mouth every 8 (eight) hours as needed. For pain     . INVOKANA 100 MG TABS 100 mg daily.    Marland Kitchen LANTUS SOLOSTAR 100 UNIT/ML Solostar Pen Inject into the skin. 40 units in the morning and 60 units at night  5  . levothyroxine (SYNTHROID, LEVOTHROID)  150 MCG tablet Take 150 mcg by mouth daily.      Marland Kitchen losartan-hydrochlorothiazide (HYZAAR) 100-25 MG per tablet Take 1 tablet by mouth daily.      . metFORMIN (GLUCOPHAGE) 1000 MG tablet Take 1,000 mg by mouth 2 (two) times daily before lunch and supper.      Marland Kitchen omega-3 acid ethyl esters (LOVAZA) 1 G capsule Take 2 g by mouth daily.      Marland Kitchen omeprazole (PRILOSEC) 20 MG capsule Take 20 mg by mouth every morning.      . ONE TOUCH ULTRA TEST test strip     . insulin lispro (HUMALOG) 100 UNIT/ML injection Inject 5-15 Units into the skin daily before supper.      . nystatin cream (MYCOSTATIN) Apply 1 application topically 2 (two) times daily. 100,000U/gram use BID x 7days prn itching (Patient not taking: Reported on 09/15/2014) 30 g 2  . terconazole (TERAZOL 7) 0.4 % vaginal cream Place 1 applicator vaginally at bedtime. One applicator full QHS for seven days of therapy (Patient not taking: Reported on  09/15/2014) 45 g 1   No current facility-administered medications for this visit.    Family History  Problem Relation Age of Onset  . Prostate cancer Father   . Pancreatic cancer Mother   . Uterine cancer Maternal Aunt     ROS:  Pertinent items are noted in HPI.  Otherwise, a comprehensive ROS was negative.  Exam:   BP 122/60 mmHg  Pulse 88  Resp 20  Ht 5' 5.5" (1.664 m)  Wt 217 lb (98.431 kg)  BMI 35.55 kg/m2  LMP 04/17/2000  Weight change: -2#  Height: 5' 5.5" (166.4 cm)  Ht Readings from Last 3 Encounters:  09/15/14 5' 5.5" (1.664 m)  09/05/13 5' 5.75" (1.67 m)  07/29/12 5' 5.34" (1.66 m)    General appearance: alert, cooperative and appears stated age Head: Normocephalic, without obvious abnormality, atraumatic Neck: no adenopathy, supple, symmetrical, trachea midline and thyroid normal to inspection and palpation Lungs: clear to auscultation bilaterally Breasts: normal appearance, no masses or tenderness Heart: regular rate and rhythm Abdomen: soft, non-tender; bowel sounds normal; no masses,  no organomegaly Extremities: extremities normal, atraumatic, no cyanosis or edema Skin: Skin color, texture, turgor normal. No rashes or lesions Lymph nodes: Cervical, supraclavicular, and axillary nodes normal. No abnormal inguinal nodes palpated Neurologic: Grossly normal   Pelvic: External genitalia:  no lesions              Urethra:  normal appearing urethra with no masses, tenderness or lesions              Bartholins and Skenes: normal                 Vagina: normal appearing vagina with normal color and whitish discharge c/w yeast, no lesions              Cervix: absent              Pap taken: No. Bimanual Exam:  Uterus:  uterus absent              Adnexa: no mass, fullness, tenderness               Rectovaginal: Confirms               Anus:  normal sphincter tone, no lesions  Chaperone was present for exam.  A:  Normal gyn exam in PMP female with H/O  TAH Dense breasts H/O Skin  candida--good today. H/O Diabetes, with mildly elevated HbA1c Elevated lipids Hypothyroidism Yeast vaginitis   P: Mammogram yearly. Pt has scheduled for June, 2016. pap smear not indicated Rx for diflucan and terazol to pharmacy.  Pt knows to call if symptoms do not fully resolve return annually or prn

## 2014-12-06 ENCOUNTER — Other Ambulatory Visit: Payer: Self-pay | Admitting: Obstetrics & Gynecology

## 2014-12-07 NOTE — Telephone Encounter (Signed)
Per. Dr. Talbert Nan, patient needs to come in for OV if having symptoms, Left Message To Call Back.

## 2014-12-07 NOTE — Telephone Encounter (Signed)
Patient left a message on the answering machine during lunch returning a call.

## 2014-12-07 NOTE — Telephone Encounter (Signed)
Left Message To Call Back  

## 2014-12-07 NOTE — Telephone Encounter (Signed)
Medication refill request: Diflucan 150 mg  Last AEX: 09/05/13 with SM  Next AEX: 12/21/2015 with SM  Last MMG (if hormonal medication request): n/a Refill authorized: Please advise.  (routed to Dr. Talbert Nan since Dr. Sabra Heck is in surgery)

## 2014-12-07 NOTE — Telephone Encounter (Signed)
Please have her come in for evaluation 

## 2014-12-09 NOTE — Telephone Encounter (Signed)
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 She is taking the cream and that is helping a little but it's not completely gone yet. She said she's going to keep taking the cream, I advised her to call us if that doesn't seem to resolve.  Dr. Sabra Heck please advise any other recommendations?

## 2014-12-10 NOTE — Telephone Encounter (Signed)
Left Message To Call Back  

## 2014-12-10 NOTE — Telephone Encounter (Signed)
RF for Diflucan done.  If does not resolve, she will need to come back.  Please make note of no additional RFs for anti-fungals without OV.

## 2014-12-15 NOTE — Telephone Encounter (Signed)
Patient aware that rx has been sent to pharmacy and if symptoms do not resolve she will need to come in for OV.

## 2015-05-16 IMAGING — US US ABDOMEN LIMITED
1 series · 13 of 25 positions shown · non-contrast
Comparison: Right upper quadrant abdominal ultrasound - [DATE];
CT abdomen pelvis - [DATE] ; abdominal MRI - [DATE] (report
only, no images).

CLINICAL DATA: Follow-up hemangioma of the liver. Fatty liver. Post
cholecystectomy.

EXAM:
ULTRASOUND ABDOMEN LIMITED RIGHT UPPER QUADRANT

[Series 1: us abdomen limited · 0.25mm/px · 13 of 30 slices shown]
[im 1/30]
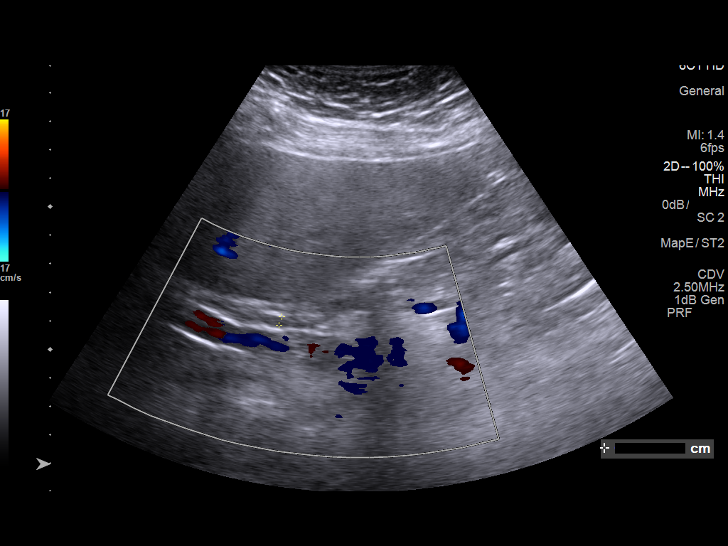
[im 3/30]
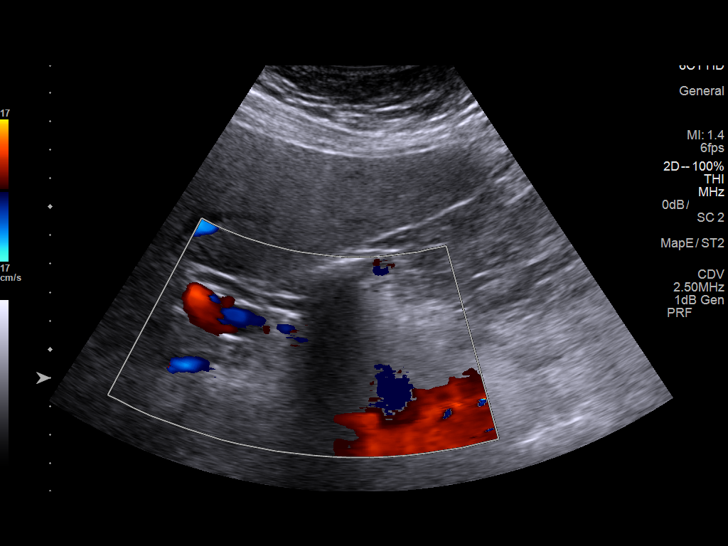
[im 5/30]
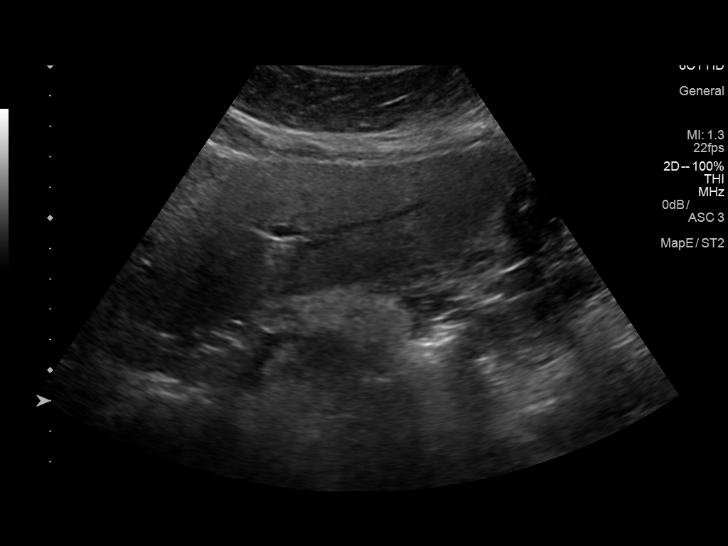
[im 8/30]
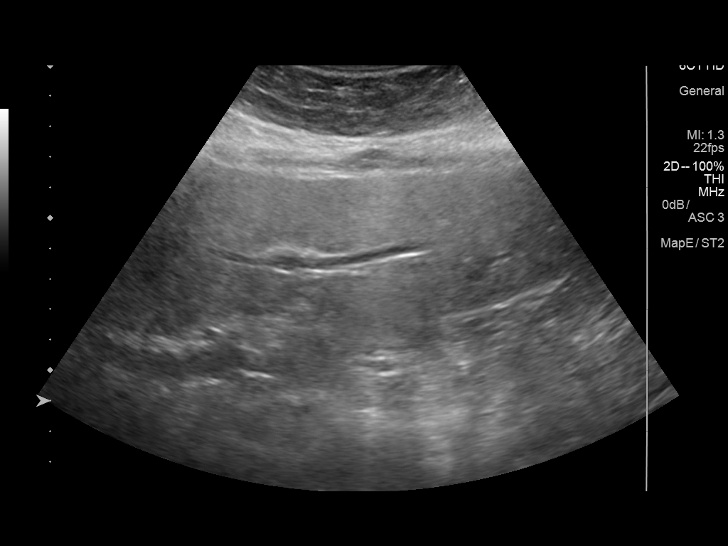
[im 10/30]
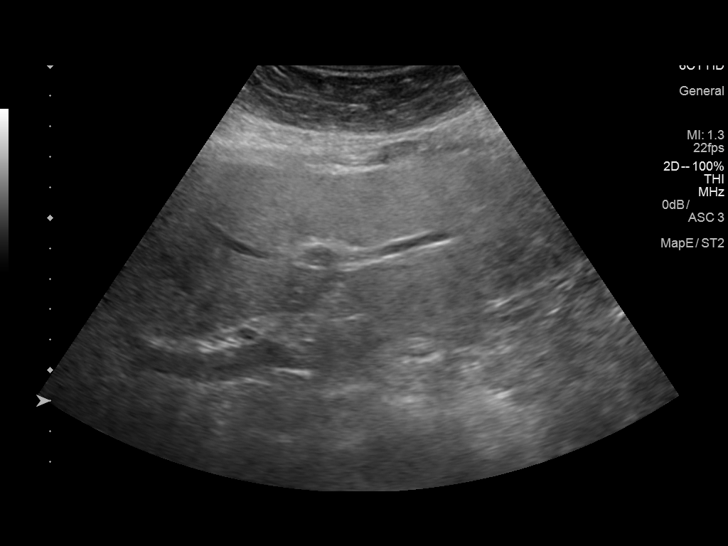
[im 13/30]
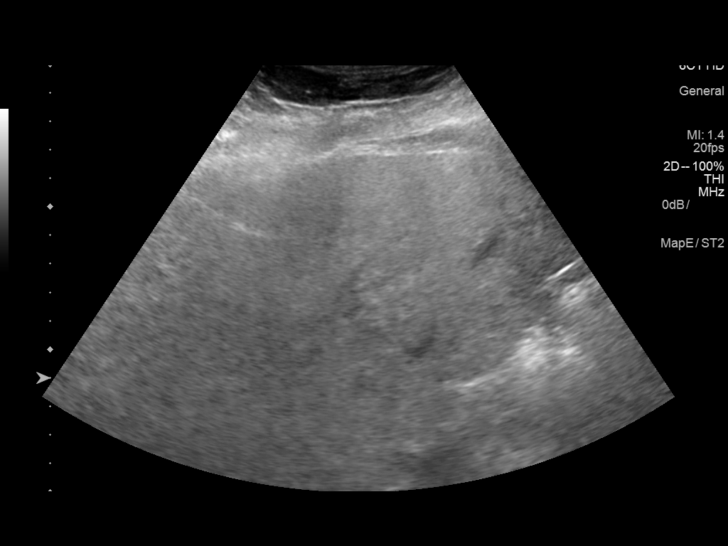
[im 15/30]
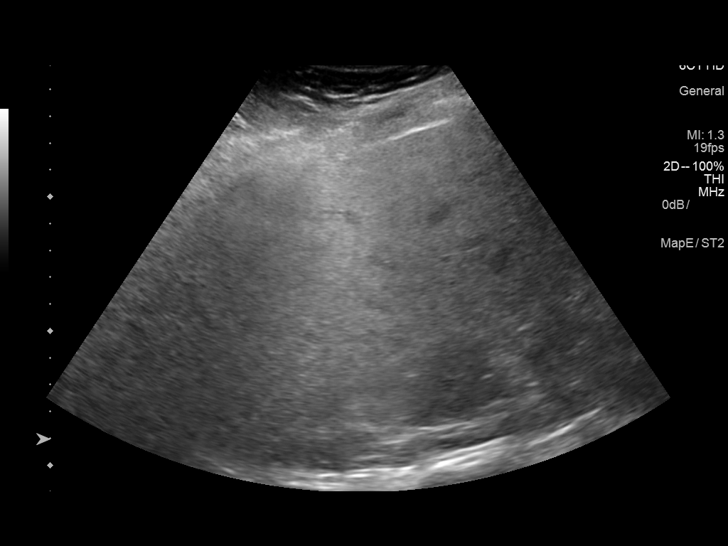
[im 17/30]
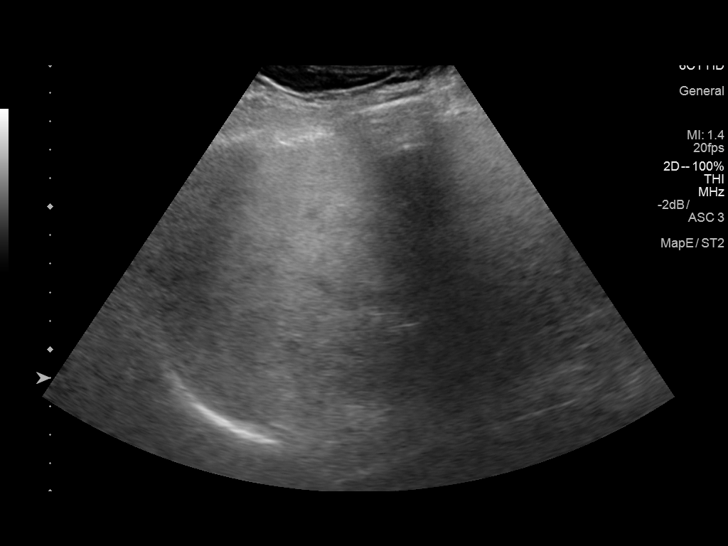
[im 20/30]
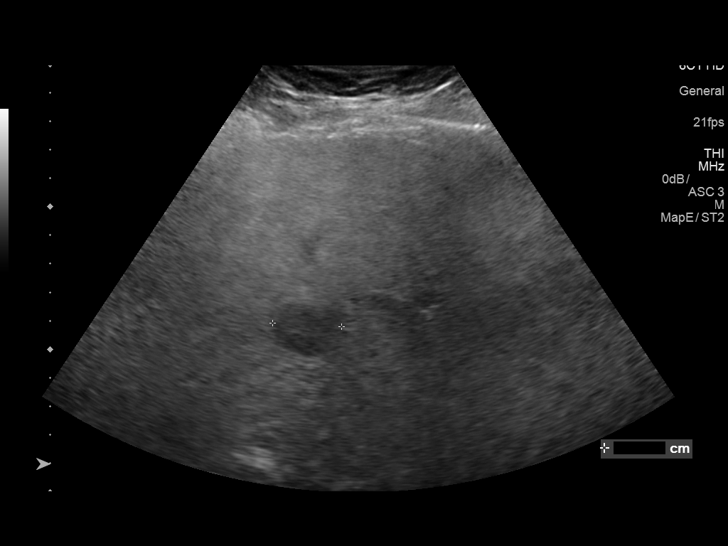
[im 22/30]
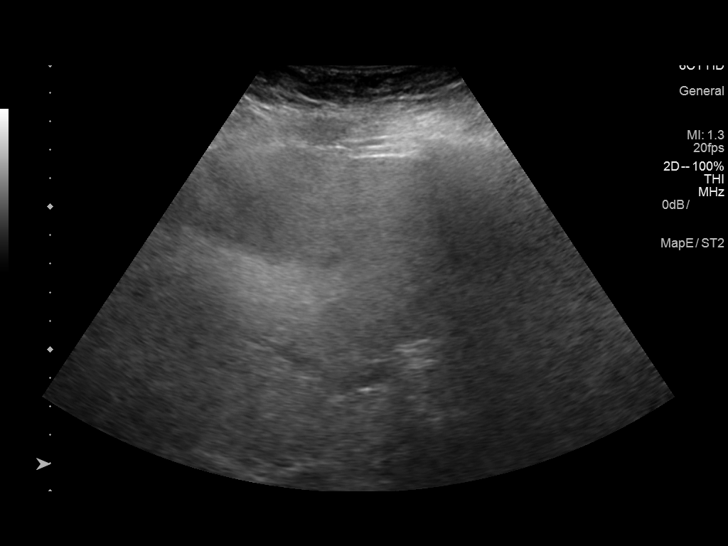
[im 25/30]
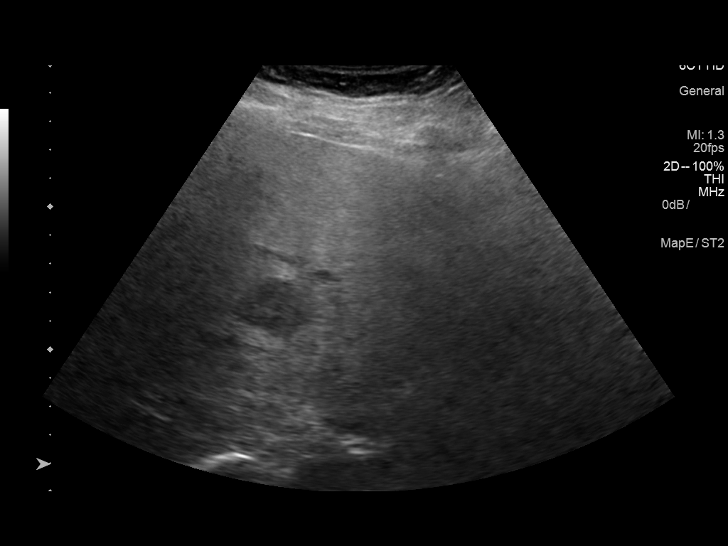
[im 27/30]
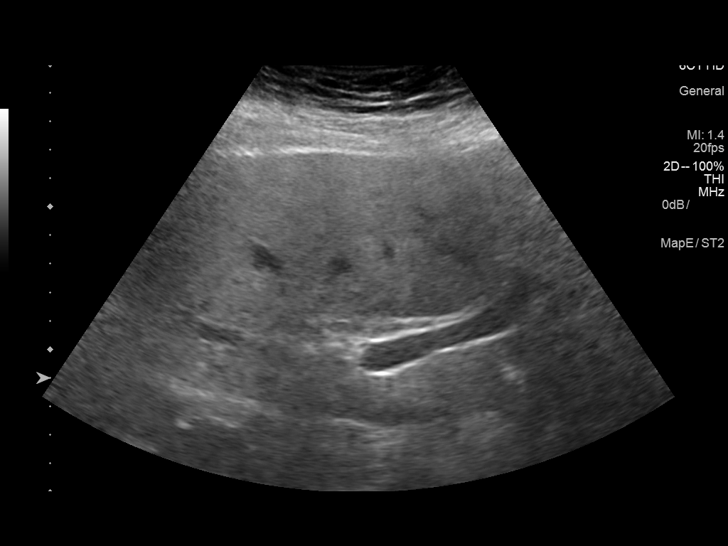
[im 30/30]
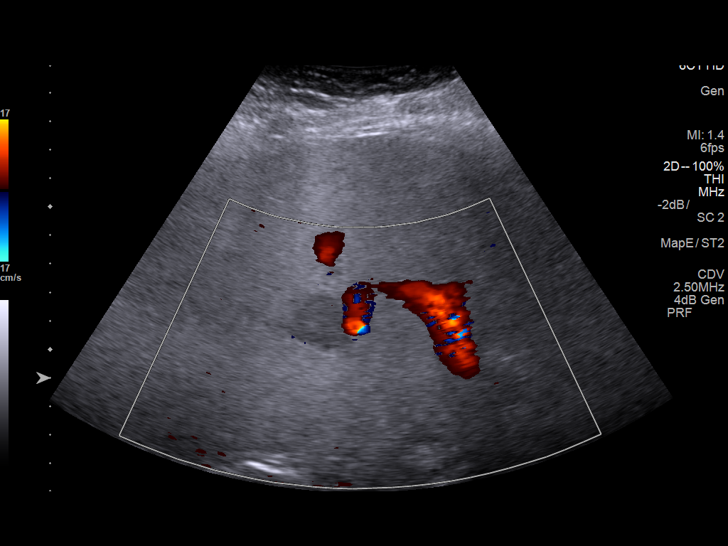

[13 of 25 positions shown; findings below may reference images not displayed]

FINDINGS: Gallbladder:

Surgically absent.

Common bile duct:

Diameter: Normal in size measuring 3.5 mm in diameter

Liver:

There is diffuse increased slightly coarsened echogenicity of the
hepatic parenchyma (representative image 16).

There is an approximately 1.9 x 1.9 x 2.4 cm hypoechoic lesion
within the right lobe of the liver which may correlate with the
approximately 2.3 cm hypoechoic lesion seen on remote abdominal
ultrasound performed [DATE] with differences potentially
attributable to scan plane projection.

No additional hepatic lesions identified. No intrahepatic biliary
duct dilatation. No ascites.
IMPRESSION: 1. The approximately 2.4 cm hypoechoic lesion within the right lobe
of the liver may correlate with the lesion seen on remote abdominal
ultrasound performed [DATE] with differences attributable to scan
plane projection. Regardless, this lesion does not appear
hyperechoic to suggest a hemangioma, though stability would be
suggestive of a benign lesion (potentially a minimally complicated
hepatic cyst). Potential imaging strategies could include definitive
evaluation with abdominal MRI versus obtaining a follow-up right
upper quadrant abdominal ultrasound in 6 months.
2. Suspected hepatic steatosis. Correlation with LFTs is
recommended.
3. Post cholecystectomy.

## 2015-11-05 ENCOUNTER — Ambulatory Visit (INDEPENDENT_AMBULATORY_CARE_PROVIDER_SITE_OTHER): Payer: BC Managed Care – PPO | Admitting: Obstetrics & Gynecology

## 2015-11-05 ENCOUNTER — Encounter: Payer: Self-pay | Admitting: Obstetrics & Gynecology

## 2015-11-05 VITALS — BP 114/76 | HR 84 | Resp 16 | Ht 66.5 in | Wt 219.0 lb

## 2015-11-05 DIAGNOSIS — Z01419 Encounter for gynecological examination (general) (routine) without abnormal findings: Secondary | ICD-10-CM

## 2015-11-05 NOTE — Progress Notes (Signed)
60 y.o. [id number] [name] here for annual exam.  Doing well.  Seeing Dr. Virgina Rollins next week.  Had a  UTI last month.  This started quickly and then improved very rapidly after having an episode of hematuria.  Pt has hx of kidney stones and feels that she passed a stone last week.    Denies vaginal bleeding.    Reports her HbA1C was 8.9 at last check.  Taking humolog with meals "as I should be" per pt.  Recheck was 7.8.    Patient's last menstrual period was 04/17/2000.          Sexually active: Yes.    The current method of family planning is status post hysterectomy.    Exercising: Yes.    walking, exercise bike Smoker:  no  Health Maintenance: Pap:  2011 normal  History of abnormal Pap:  no MMG:  2016 at [facility], normal per patient  Colonoscopy:  08/21/2007 normal repeat 10 years, Dr. Henrene Rollins BMD:   2009 with Dr. Virgina Rollins, normal per patient TDaP:  04/17/2009 Pneumonia vaccine(s):  01/16/2011  Zostavax:   never Hep C testing: PCP.  Having blood work next week. Screening Labs: PCP, Hb today: PCP, Urine today: PCP   reports that she has never smoked. She has never used smokeless tobacco. She reports that she does not drink alcohol or use illicit drugs.  Past Medical History  Diagnosis Date  . DVT (deep venous thrombosis) 1980  . Anxiety   . Panic disorder   . Hyperlipidemia   . Hypertriglyceridemia   . Diabetes mellitus   . Liver hemangioma     right lobe  . Asthma   . Nephrolithiasis   . GERD (gastroesophageal reflux disease)     Past Surgical History  Procedure Laterality Date  . Total abdominal hysterectomy  2002    ovaries remain  . Cholecystectomy  2005  . Knee arthroscopy Left 12/02  . Knee arthroscopy Right 12/03  . Breast ductal system excision  8/99  . Thyroidectomy, partial Right 1994  . Thyroidectomy, partial Left 1996  . Combined hysteroscopy diagnostic / d&c  1998  . Incontinence surgery  1999  . Replacement total knee Left 5/11  . Replacement total knee  Right 11/12  . Other surgical history  11/13    Rotator Cuff Sugery    Current Outpatient Prescriptions  Medication Sig Dispense Refill  . albuterol (PROVENTIL HFA;VENTOLIN HFA) 108 (90 BASE) MCG/ACT inhaler Inhale 2 puffs into the lungs every 6 (six) hours as needed. For shortness of breath     . B-D ULTRAFINE III SHORT PEN 31G X 8 MM MISC     . Biotin 1000 MCG tablet Take 1,000 mcg by mouth daily.      . Cholecalciferol (VITAMIN D PO) Take by mouth daily.    . Cyanocobalamin (VITAMIN B 12 PO) Take by mouth daily.    Marland Kitchen escitalopram (LEXAPRO) 20 MG tablet Take 20 mg by mouth daily.  5  . fenofibrate 160 MG tablet Take 160 mg by mouth at bedtime.      . fluconazole (DIFLUCAN) 150 MG tablet TAKE 1 TABLET (150 MG TOTAL) BY MOUTH ONCE. REPEAT IN 72 HOURS. 2 tablet 1  . glimepiride (AMARYL) 4 MG tablet Take 4 mg by mouth 2 (two) times daily before lunch and supper.      Marland Kitchen ibuprofen (ADVIL,MOTRIN) 100 MG tablet Take 800 mg by mouth every 8 (eight) hours as needed. For pain     .  insulin lispro (HUMALOG) 100 UNIT/ML injection Inject 5-15 Units into the skin daily before supper.      . INVOKANA 100 MG TABS 100 mg daily.    Marland Kitchen LANTUS SOLOSTAR 100 UNIT/ML Solostar Pen Inject into the skin. 40 units in the morning and 60 units at night  5  . levothyroxine (SYNTHROID, LEVOTHROID) 150 MCG tablet Take 150 mcg by mouth daily.      Marland Kitchen losartan-hydrochlorothiazide (HYZAAR) 100-25 MG per tablet Take 1 tablet by mouth daily.      . metFORMIN (GLUCOPHAGE) 1000 MG tablet Take 1,000 mg by mouth 2 (two) times daily before lunch and supper.      . nystatin cream (MYCOSTATIN) Apply 1 application topically 2 (two) times daily. 100,000U/gram use BID x 7days prn itching (Patient not taking: Reported on 09/15/2014) 30 g 2  . omega-3 acid ethyl esters (LOVAZA) 1 G capsule Take 2 g by mouth daily.      Marland Kitchen omeprazole (PRILOSEC) 20 MG capsule Take 20 mg by mouth every morning.      . ONE TOUCH ULTRA TEST test strip     .  terconazole (TERAZOL 7) 0.4 % vaginal cream Place 1 applicator vaginally at bedtime. One applicator full QHS for seven days of therapy 45 g 1   No current facility-administered medications for this visit.    Family History  Problem Relation Age of Onset  . Prostate cancer Father   . Pancreatic cancer Mother   . Uterine cancer Maternal Aunt     ROS:  Pertinent items are noted in HPI.  Otherwise, a comprehensive ROS was negative.  Exam:   Filed Vitals:   11/05/15 1305  BP: 114/76  Pulse: 84  Resp: 16  Height: 5' 6.5" (1.689 m)  Weight: 219 lb (99.338 kg)    General appearance: alert, cooperative and appears stated age Head: Normocephalic, without obvious abnormality, atraumatic Neck: no adenopathy, supple, symmetrical, trachea midline and thyroid normal to inspection and palpation Lungs: clear to auscultation bilaterally Breasts: normal appearance, no masses or tenderness Heart: regular rate and rhythm Abdomen: soft, non-tender; bowel sounds normal; no masses,  no organomegaly Extremities: extremities normal, atraumatic, no cyanosis or edema Skin: Skin color, texture, turgor normal. No rashes or lesions Lymph nodes: Cervical, supraclavicular, and axillary nodes normal. No abnormal inguinal nodes palpated Neurologic: Grossly normal   Pelvic: External genitalia:  no lesions              Urethra:  normal appearing urethra with no masses, tenderness or lesions              Bartholins and Skenes: normal                 Vagina: normal appearing vagina with normal color and discharge, no lesions              Cervix: absent              Pap taken: No. Bimanual Exam:  Uterus:  uterus absent              Adnexa: no mass, fullness, tenderness               Rectovaginal: Confirms               Anus:  normal sphincter tone, no lesions  Chaperone was present for exam.  A:  Normal gyn exam in PMP female with H/O TAH Dense breasts H/O Skin candida--good today. H/O Diabetes.   Last Hb  A1C was 7.8 Elevated lipids Hypothyroidism, s/p thyroid resection H/O vulvar and vaginal yeast infections.  P: Mammogram yearly. Release signed for last MMG. pap smear not indicated Does not need rx fpr Diflucan or Terazol but if she does, she will call.  I typically give her rx due to oral agents and diabetes Return annually or prn

## 2015-11-19 ENCOUNTER — Ambulatory Visit (INDEPENDENT_AMBULATORY_CARE_PROVIDER_SITE_OTHER): Payer: BC Managed Care – PPO | Admitting: Podiatry

## 2015-11-19 ENCOUNTER — Encounter: Payer: Self-pay | Admitting: Podiatry

## 2015-11-19 VITALS — BP 139/71 | HR 83 | Resp 14

## 2015-11-19 DIAGNOSIS — L989 Disorder of the skin and subcutaneous tissue, unspecified: Secondary | ICD-10-CM

## 2015-11-19 DIAGNOSIS — B351 Tinea unguium: Secondary | ICD-10-CM | POA: Diagnosis not present

## 2015-11-19 NOTE — Progress Notes (Signed)
   Subjective:    Patient ID: [name], female    DOB: 1955/06/21, 60 y.o.   MRN: [id number]  HPI this patient presents the office with chief complaint of a painful wart on the bottom of her left foot. She says she was at her medical doctor who told her to increase her activity and then she developed a painful warts. He then told her to come to the office today to be evaluated. She says she has developed a skin lesion under the outside ball of her left foot which is painful walking and wearing her shoes. There is a clear waxy lesion noted under the outside ball of the left foot. She also says that she has discoloration noted to the great toenails on both feet in the absence of pain. She presents the office today for an evaluation and treatment of these conditions    Review of Systems  All other systems reviewed and are negative.      Objective:   Physical Exam  GENERAL APPEARANCE: Alert, conversant. Appropriately groomed. No acute distress.  VASCULAR: Pedal pulses are  palpable at  [facility] and PT bilateral.  Capillary refill time is immediate to all digits,  Normal temperature gradient.   NEUROLOGIC: sensation is normal to 5.07 monofilament at 5/5 sites bilateral.  Light touch is intact bilateral, Muscle strength normal.  MUSCULOSKELETAL: acceptable muscle strength, tone and stability bilateral.  Intrinsic muscluature intact bilateral.  Rectus appearance of foot and digits noted bilateral.   DERMATOLOGIC: skin color, texture, and turgor are within normal limits.  No preulcerative lesions or ulcers  are seen, no interdigital maceration noted.  No open lesions present.  Digital nails are asymptomatic.  There is discoloration of hallux toenails with no evidence of infection. No drainage noted. Benign skin lesion resembling porokeratosis noted sub 5th metatarsal left foot.         Assessment & Plan:  Benign skin lesion  Porokeratosis left foot.  Onychomycosis hallux nail plate B/l   IE   Application of canthacur to benign skin lesion.  Told patient to pick up topical fungus solution when she makes a return appointment for one week.   Gardiner Barefoot DPM

## 2015-11-26 ENCOUNTER — Ambulatory Visit (INDEPENDENT_AMBULATORY_CARE_PROVIDER_SITE_OTHER): Payer: BC Managed Care – PPO | Admitting: Podiatry

## 2015-11-26 VITALS — BP 129/73 | HR 90 | Resp 14

## 2015-11-26 DIAGNOSIS — Q828 Other specified congenital malformations of skin: Secondary | ICD-10-CM

## 2015-11-26 DIAGNOSIS — L989 Disorder of the skin and subcutaneous tissue, unspecified: Secondary | ICD-10-CM

## 2015-11-26 NOTE — Addendum Note (Signed)
Addended by: Harriett Sine D on: 11/26/2015 01:10 PM   Modules accepted: Orders

## 2015-11-26 NOTE — Progress Notes (Signed)
Patient returns to the office follow-up for diagnosis of a benign skin lesion, left foot. Patient was diagnosed with a skin lesion and/or pro-keratoses sub-fifth metatarsal, left foot. She was treated with canthacur in the office.  Patient states there has been minimal reactivity and minimal pain from the acid, but the pain from the skin lesion persists at this juncture, we decided to excise the skin lesion on the left forefoot under local anesthesia.  Neurovascular status intact as the previous visit. There is a well-defined well-circumscribed weight skin lesion noted under the fifth metatarsal of the left foot. This is painful to the touch  Benign skin lesion left foot  Porokeratosis left foot.  ROV  Excision skin lesion left foot.  This patient was anesthetized with mixture of 2% lidocaine plain and 2 % lidocaine with epi. at the site of the skin lesion.  The surgical site was then washed with betadine and alcohol.  Using a punch the lesion was excised and passed off as specimen.  The surgical site was cauterized with phenol and washed with alcohol.  The site was bandaged with neosporin, sterile 2x2 and kling.  Home instructions given.   Gardiner Barefoot DPM

## 2015-12-03 ENCOUNTER — Telehealth: Payer: Self-pay | Admitting: *Deleted

## 2015-12-03 ENCOUNTER — Encounter: Payer: Self-pay | Admitting: Podiatry

## 2015-12-03 ENCOUNTER — Ambulatory Visit (INDEPENDENT_AMBULATORY_CARE_PROVIDER_SITE_OTHER): Payer: BC Managed Care – PPO | Admitting: Podiatry

## 2015-12-03 VITALS — BP 127/70 | HR 91 | Resp 14

## 2015-12-03 DIAGNOSIS — Z09 Encounter for follow-up examination after completed treatment for conditions other than malignant neoplasm: Secondary | ICD-10-CM

## 2015-12-03 NOTE — Progress Notes (Signed)
This patient returns to the office 1 week after excision of the skin lesion on the bottom of her left foot. She says she has been soaking her foot and bandaging her foot. She says there is still a little pain and that there is drainage coming from the surgical site. She says all in all her foot is markedly improved   GENERAL APPEARANCE: Alert, conversant. Appropriately groomed. No acute distress.  VASCULAR: Pedal pulses are  palpable at  [facility] and PT bilateral.  Capillary refill time is immediate to all digits,  Normal temperature gradient.  Digital hair growth is present bilateral  NEUROLOGIC: sensation is normal to 5.07 monofilament at 5/5 sites bilateral.  Light touch is intact bilateral, Muscle strength normal.  MUSCULOSKELETAL: acceptable muscle strength, tone and stability bilateral.  Intrinsic muscluature intact bilateral.  Rectus appearance of foot and digits noted bilateral.   DERMATOLOGIC: skin color, texture, and turgor are within normal limits.  No preulcerative lesions or ulcers  are seen, no interdigital maceration noted.  No open lesions present.  Digital nails are asymptomatic. Healing noted under fifth metatarsal left foot.  No redness or swelling noted.  Healing tissue at surgical site.   Postoperative visit   ROV  Debride necrotic tissue.  Neosporin/DSD.  RTC prn  Home instructions given.   Gardiner Barefoot DPM

## 2015-12-03 NOTE — Telephone Encounter (Signed)
[name] states the pathologist request clinical information on pt.  I spoke with Judeen Hammans and she asked if any other treatments were used on the biopsied area prior to the surgical removal. I told Judeen Hammans 11/19/2015 Canthacur had been applied.

## 2015-12-11 ENCOUNTER — Other Ambulatory Visit: Payer: Self-pay | Admitting: Obstetrics & Gynecology

## 2015-12-13 ENCOUNTER — Telehealth: Payer: Self-pay | Admitting: Obstetrics & Gynecology

## 2015-12-13 MED ORDER — FLUCONAZOLE 150 MG PO TABS: 150.0000 mg | ORAL_TABLET | Freq: Once | ORAL | 0 refills | Status: AC

## 2015-12-13 NOTE — Telephone Encounter (Signed)
Yes, ok to send in RF for Diflucan as you've noted.  Thanks.

## 2015-12-13 NOTE — Telephone Encounter (Signed)
Spoke with patient. Advised patient that rx for Diflucan 15o mg po x 1, repeat in 72 hours if symptoms persists #2 0RF has been sent to [facility] on file. She is agreeable.  Will close encounter.

## 2015-12-13 NOTE — Telephone Encounter (Signed)
Patient has a history of vulvar and vaginal yeast infections. Patient is also a diabetic. Patient is requesting refill on Diflucan at this time. Denies any current symptoms. Per OV note on 11/05/2015 patient was to return call to office when refill is needed.  Dr.Miller, okay to place rx for Diflcan 150 mg take po x 1, repeat in 72 hours if symptoms persist #2 0RF?

## 2015-12-13 NOTE — Telephone Encounter (Signed)
Diflucan refill requested by patient. She said, "Dr. Sabra Heck told me to call if I ever needed refills on Diflucan. I like to keep a supply available in case I need it. I used it a few weeks ago so I need another refill in case I need it."  Pharmacy on file is correct.

## 2015-12-21 ENCOUNTER — Ambulatory Visit: Payer: BC Managed Care – PPO | Admitting: Obstetrics & Gynecology

## 2016-01-11 ENCOUNTER — Other Ambulatory Visit (INDEPENDENT_AMBULATORY_CARE_PROVIDER_SITE_OTHER): Payer: Self-pay | Admitting: Orthopaedic Surgery

## 2016-01-11 DIAGNOSIS — M25512 Pain in left shoulder: Secondary | ICD-10-CM

## 2016-02-15 ENCOUNTER — Encounter (INDEPENDENT_AMBULATORY_CARE_PROVIDER_SITE_OTHER): Payer: Self-pay | Admitting: *Deleted

## 2016-06-23 ENCOUNTER — Other Ambulatory Visit: Payer: Self-pay | Admitting: Internal Medicine

## 2016-06-23 DIAGNOSIS — D1803 Hemangioma of intra-abdominal structures: Secondary | ICD-10-CM

## 2016-06-30 ENCOUNTER — Other Ambulatory Visit: Payer: BC Managed Care – PPO

## 2016-08-08 ENCOUNTER — Ambulatory Visit (INDEPENDENT_AMBULATORY_CARE_PROVIDER_SITE_OTHER): Payer: BC Managed Care – PPO | Admitting: Podiatry

## 2016-08-08 DIAGNOSIS — R52 Pain, unspecified: Secondary | ICD-10-CM

## 2016-08-08 DIAGNOSIS — T148XXA Other injury of unspecified body region, initial encounter: Secondary | ICD-10-CM | POA: Diagnosis not present

## 2016-08-08 DIAGNOSIS — W450XXA Nail entering through skin, initial encounter: Secondary | ICD-10-CM

## 2016-08-09 NOTE — Progress Notes (Signed)
   Subjective:    Patient ID: [name], female    DOB: 14-Sep-1955, 61 y.o.   MRN: [id number]  HPI this patient presents the office with chief complaint of a callus left foot.  She also says she is concerned about her big toe, right foot. She says she dropped a metal object on the top of her right big toe and landed at the base of her nail since that injury. The nail near the cuticle has not grown and the nail at the tip of the toe has continued to grow loosened.  She says she catches this nail on socks and it becomes painful during walking.   . She presents the office today for an evaluation and treatment of these conditions.  Patient is diabetic and says her Hc1AC was in the sevens.    Review of Systems  All other systems reviewed and are negative.      Objective:   Physical Exam  GENERAL APPEARANCE: Alert, conversant. Appropriately groomed. No acute distress.  VASCULAR: Pedal pulses are  palpable at  [facility] and PT bilateral.  Capillary refill time is immediate to all digits,  Normal temperature gradient.   NEUROLOGIC: sensation is normal to 5.07 monofilament at 5/5 sites bilateral.  Light touch is intact bilateral, Muscle strength normal.  MUSCULOSKELETAL: acceptable muscle strength, tone and stability bilateral.  Intrinsic muscluature intact bilateral.  Rectus appearance of foot and digits noted bilateral.   DERMATOLOGIC: skin color, texture, and turgor are within normal limits.  No preulcerative lesions or ulcers  are seen, no interdigital maceration noted.  No open lesions present.  Digital nails are asymptomatic.   No drainage noted. Benign skin lesion resembling porokeratosis noted sub 5th metatarsal left foot.  NAILS  proximal hallux toenail, right foot is absent at the proximal nail fold. No evidence of any redness, swelling or pain noted in the distal two thirds of the nail is mildly attached to the nail bed         Assessment & Plan:  Benign skin lesion  Porokeratosis left  foot.  Injured nail right hallux toenail.   Debride porokeratoisis left foot.  Nail surgery right halux.  Treatment options and alternatives discussed.  Recommended an incision and drainage and patient agreed.  Right hallux was prepped with alcohol and a 3cc. of  2% lidocaine plain was administered in a digital block fashion.  The toe was then prepped with betadine solution .  The offending nail  was then excised and all necrotic tissue was resected.  The area was then cleansed  and antibiotic ointment and a dry sterile dressing was applied.  The patient was dispensed instructions for aftercare.  RTC 1 week.   Gardiner Barefoot DPM

## 2016-08-16 ENCOUNTER — Ambulatory Visit (INDEPENDENT_AMBULATORY_CARE_PROVIDER_SITE_OTHER): Payer: Self-pay | Admitting: Podiatry

## 2016-08-16 DIAGNOSIS — Z09 Encounter for follow-up examination after completed treatment for conditions other than malignant neoplasm: Secondary | ICD-10-CM

## 2016-08-16 NOTE — Progress Notes (Signed)
This patient returns to the office following nail surgery one week ago.  The patient says toe has been soaked and bandaged as directed.  There has been improvement of the toe since the surgery has been performed. The patient presents for continued evaluation and treatment.  GENERAL APPEARANCE: Alert, conversant. Appropriately groomed. No acute distress.  VASCULAR: Pedal pulses palpable at  [facility] and PT bilateral.  Capillary refill time is immediate to all digits,  Normal temperature gradient.    NEUROLOGIC: sensation is normal to 5.07 monofilament at 5/5 sites bilateral.  Light touch is intact bilateral, Muscle strength normal.  MUSCULOSKELETAL: acceptable muscle strength, tone and stability bilateral.  Intrinsic muscluature intact bilateral.  Rectus appearance of foot and digits noted bilateral.   DERMATOLOGIC: skin color, texture, and turgor are within normal limits.  No preulcerative lesions or ulcers  are seen, no interdigital maceration noted.   NAILS  There is necrotic tissue along the nail groove  In the absence of redness swelling and pain.  DX  S/p nail surgery  ROV  Home instructions were discussed.  Patient to call the office if there are any questions or concerns.   Eduar Kumpf DPM   

## 2016-10-31 ENCOUNTER — Other Ambulatory Visit: Payer: Self-pay | Admitting: Internal Medicine

## 2016-10-31 DIAGNOSIS — D1803 Hemangioma of intra-abdominal structures: Secondary | ICD-10-CM

## 2016-10-31 DIAGNOSIS — K760 Fatty (change of) liver, not elsewhere classified: Secondary | ICD-10-CM

## 2016-11-08 ENCOUNTER — Other Ambulatory Visit: Payer: BC Managed Care – PPO

## 2016-11-10 ENCOUNTER — Ambulatory Visit
Admission: RE | Admit: 2016-11-10 | Discharge: 2016-11-10 | Disposition: A | Discharge status: Home / Self Care | Payer: BC Managed Care – PPO | Source: Ambulatory Visit | Attending: Internal Medicine | Admitting: Internal Medicine

## 2016-11-10 DIAGNOSIS — D1803 Hemangioma of intra-abdominal structures: Secondary | ICD-10-CM

## 2016-11-10 DIAGNOSIS — K760 Fatty (change of) liver, not elsewhere classified: Secondary | ICD-10-CM

## 2016-12-10 HISTORY — PX: OTHER SURGICAL HISTORY: SHX169

## 2017-02-02 ENCOUNTER — Ambulatory Visit (INDEPENDENT_AMBULATORY_CARE_PROVIDER_SITE_OTHER): Payer: BC Managed Care – PPO | Admitting: Obstetrics & Gynecology

## 2017-02-02 ENCOUNTER — Encounter: Payer: Self-pay | Admitting: Obstetrics & Gynecology

## 2017-02-02 VITALS — BP 136/60 | HR 94 | Resp 18 | Ht 65.25 in | Wt 218.0 lb

## 2017-02-02 DIAGNOSIS — E781 Pure hyperglyceridemia: Secondary | ICD-10-CM | POA: Diagnosis not present

## 2017-02-02 DIAGNOSIS — I10 Essential (primary) hypertension: Secondary | ICD-10-CM | POA: Insufficient documentation

## 2017-02-02 DIAGNOSIS — J45901 Unspecified asthma with (acute) exacerbation: Secondary | ICD-10-CM

## 2017-02-02 DIAGNOSIS — Z01419 Encounter for gynecological examination (general) (routine) without abnormal findings: Secondary | ICD-10-CM | POA: Diagnosis not present

## 2017-02-02 DIAGNOSIS — Z86718 Personal history of other venous thrombosis and embolism: Secondary | ICD-10-CM | POA: Insufficient documentation

## 2017-02-02 DIAGNOSIS — J45909 Unspecified asthma, uncomplicated: Secondary | ICD-10-CM | POA: Insufficient documentation

## 2017-02-02 DIAGNOSIS — F410 Panic disorder [episodic paroxysmal anxiety] without agoraphobia: Secondary | ICD-10-CM | POA: Diagnosis not present

## 2017-02-02 MED ORDER — NYSTATIN 100000 UNIT/GM EX POWD: Freq: Two times a day (BID) | CUTANEOUS | 2 refills | Status: DC

## 2017-02-02 NOTE — Progress Notes (Signed)
61 y.o. [id number] MarriedCaucasianF here for annual exam.  Denies vaginal bleeding.    PCP:  Dr. Virgina Jock.  hbA1C was 7.8.  Is seen every six months.  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  On antibiotics for a superficial skin infection.    Patient's last menstrual period was 04/17/2000.          Sexually active: No.  The current method of family planning is status post hysterectomy.    Exercising: No.  The patient does not participate in regular exercise at present. Smoker:  no  Health Maintenance: Pap:  2011 normal  History of abnormal Pap:  no MMG:  01/08/15 Diagnostic Left  BIRADS2:benign. 02/2016 Normal per pt. Will schedule appt for next month.  Colonoscopy:  2009 normal   BMD:   2009  TDaP:  2011 Pneumonia vaccine(s):  2012 Shingrix:   10/2016, 12/2016  Hep C testing: Done with PCP Screening Labs: PCP   reports that she has never smoked. She has never used smokeless tobacco. She reports that she does not drink alcohol or use drugs.  Past Medical History:  Diagnosis Date  . Anxiety   . Asthma   . Diabetes mellitus ([place])   . DVT (deep venous thrombosis) ([place]) 1980  . GERD (gastroesophageal reflux disease)   . Hyperlipidemia   . Hypertriglyceridemia   . Liver hemangioma    right lobe  . Nephrolithiasis   . Panic disorder     Past Surgical History:  Procedure Laterality Date  . BREAST DUCTAL SYSTEM EXCISION  8/99  . CHOLECYSTECTOMY  2005  . COMBINED HYSTEROSCOPY DIAGNOSTIC / D&C  1998  . INCONTINENCE SURGERY  1999  . KNEE ARTHROSCOPY Left 12/02  . KNEE ARTHROSCOPY Right 12/03  . OTHER SURGICAL HISTORY  11/13   Rotator Cuff Sugery  . REPLACEMENT TOTAL KNEE Left 5/11  . REPLACEMENT TOTAL KNEE Right 11/12  . THYROIDECTOMY, PARTIAL Right 1994  . THYROIDECTOMY, PARTIAL Left 1996  . TOTAL ABDOMINAL HYSTERECTOMY  2002   ovaries remain    Current Outpatient Prescriptions  Medication Sig Dispense Refill  . albuterol (PROVENTIL HFA;VENTOLIN HFA) 108 (90  BASE) MCG/ACT inhaler Inhale 2 puffs into the lungs every 6 (six) hours as needed. For shortness of breath     . B-D ULTRAFINE III SHORT PEN 31G X 8 MM MISC     . Biotin 1000 MCG tablet Take 5,000 mcg by mouth daily.     . cephALEXin (KEFLEX) 500 MG capsule Take 500 mg by mouth 2 (two) times daily.  0  . Cholecalciferol (VITAMIN D PO) Take by mouth daily.    . Cyanocobalamin (VITAMIN B 12 PO) Take by mouth daily.    Marland Kitchen desonide (DESOWEN) 0.05 % ointment APPLY ON EYELIDS TWICE DAILY FOR 3 DAYS WHEN FLARED  1  . escitalopram (LEXAPRO) 20 MG tablet Take 20 mg by mouth daily.  5  . fenofibrate 160 MG tablet Take 160 mg by mouth at bedtime.      . fluconazole (DIFLUCAN) 150 MG tablet TAKE 1 TABLET (150 MG TOTAL) BY MOUTH ONCE. REPEAT IN 72 HOURS. 2 tablet 1  . HALOG 0.1 % OINT APPLY TO AFFECTED AREA 1-2 TIMES DAILY FOR 10 DAYS  0  . ibuprofen (ADVIL,MOTRIN) 100 MG tablet Take 800 mg by mouth every 8 (eight) hours as needed. For pain     . JARDIANCE 25 MG TABS tablet TAKE 1 TABLET BY MOUTH EVERY DAY OK TO REPLACE INVOKANA AFTER 04-18-15 BASED  ON FORMULARY COVERAGE  11  . levothyroxine (SYNTHROID, LEVOTHROID) 150 MCG tablet Take 150 mcg by mouth daily.      Marland Kitchen losartan-hydrochlorothiazide (HYZAAR) 100-25 MG per tablet Take 1 tablet by mouth daily.      . metFORMIN (GLUCOPHAGE) 1000 MG tablet Take 1,000 mg by mouth 2 (two) times daily before lunch and supper.      Marland Kitchen NOVOLOG FLEXPEN 100 UNIT/ML FlexPen USE AS DIRECTED 2-3 TIMES DAILY WITH MEALS PER SLIDING SCALE  6  . nystatin cream (MYCOSTATIN) Apply 1 application topically 2 (two) times daily. 100,000U/gram use BID x 7days prn itching 30 g 2  . omega-3 acid ethyl esters (LOVAZA) 1 G capsule Take 2 g by mouth daily.      Marland Kitchen omeprazole (PRILOSEC) 20 MG capsule Take 20 mg by mouth every morning.      . ONE TOUCH ULTRA TEST test strip     . terconazole (TERAZOL 7) 0.4 % vaginal cream Place 1 applicator vaginally at bedtime. One applicator full QHS for seven  days of therapy 45 g 1  . TRESIBA FLEXTOUCH 200 UNIT/ML SOPN INJECT 40 UNITS IN THE MORNING AND 60 UNITS IN THE EVENING  9   No current facility-administered medications for this visit.     Family History  Problem Relation Age of Onset  . Prostate cancer Father   . Pancreatic cancer Mother   . Uterine cancer Maternal Aunt     ROS:  Pertinent items are noted in HPI.  Otherwise, a comprehensive ROS was negative.  Exam:   BP 136/60 (BP Location: Right Arm, Patient Position: Sitting, Cuff Size: Large)   Pulse 94   Resp 18   Ht 5' 5.25" (1.657 m)   Wt 218 lb (98.9 kg)   LMP 04/17/2000   BMI 36.00 kg/m   Weight change: -1#   Height: 5' 5.25" (165.7 cm)  Ht Readings from Last 3 Encounters:  02/02/17 5' 5.25" (1.657 m)  11/05/15 5' 6.5" (1.689 m)  09/15/14 5' 5.5" (1.664 m)   General appearance: alert, cooperative and appears stated age Head: Normocephalic, without obvious abnormality, atraumatic Neck: no adenopathy, supple, symmetrical, trachea midline and thyroid normal to inspection and palpation Lungs: clear to auscultation bilaterally Breasts: normal appearance, no masses or tenderness Heart: regular rate and rhythm Abdomen: soft, non-tender; bowel sounds normal; no masses,  no organomegaly Extremities: extremities normal, atraumatic, no cyanosis or edema Skin: Skin color, texture, turgor normal. No rashes or lesions Lymph nodes: Cervical, supraclavicular, and axillary nodes normal. No abnormal inguinal nodes palpated Neurologic: Grossly normal   Pelvic: External genitalia:  no lesions              Urethra:  normal appearing urethra with no masses, tenderness or lesions              Bartholins and Skenes: normal                 Vagina: normal appearing vagina with normal color and discharge, no lesions              Cervix: absent              Pap taken: No. Bimanual Exam:  Uterus:  uterus absent              Adnexa: no mass, fullness, tenderness                Rectovaginal: Confirms  Anus:  normal sphincter tone, no lesions  Chaperone was present for exam.  A:  Well Woman with normal exam PMP, no HRT H/o dense breast tissue H/o skin candida H/O DVT in her 20's Diabetes. Elevated triglyceridew Hypothyroidism after bilateral partial thyroidectomy  P:   Mammogram guidelines reviewed.  Release for 2017 MMG will be signed. pap smear not obtained Lab work UTD Rx for nystatin powder bid prn.  #30gm/2RF. return annually or prn

## 2017-02-06 ENCOUNTER — Telehealth: Payer: Self-pay | Admitting: Obstetrics & Gynecology

## 2017-02-06 ENCOUNTER — Other Ambulatory Visit: Payer: Self-pay | Admitting: Obstetrics & Gynecology

## 2017-02-06 MED ORDER — FLUCONAZOLE 150 MG PO TABS: ORAL_TABLET | ORAL | 1 refills | Status: DC

## 2017-02-06 NOTE — Telephone Encounter (Signed)
Returned call, no answer, mailbox full unable to leave message.

## 2017-02-06 NOTE — Telephone Encounter (Signed)
Please let pt know I sent in Rx for diflucan with refill if needed.

## 2017-02-06 NOTE — Telephone Encounter (Signed)
Patient was seen last 02/02/17 for her aex. Patient states that "something has changed and needs a new prescription for diflucan".

## 2017-02-06 NOTE — Telephone Encounter (Signed)
Spoke with patient. Patient requesting RX for yeast. Was seen in office on 10/19 started nystatin powder for external yeast. Patient states she also started keflex the same day, prescribed by another provider, feels yeast is now internal.   Reports vaginal itching is now internal with burning. Denies urinary complaints,bleeding, vaginal d/c or odor.   Patient states she is leaving to go out of town tomorrow. Advised patient would review with Dr. Sabra Heck and return call with recommendations, patient is agreeable. Verified pharmacy on file.  Dr. Sabra Heck -please review and advise on RX?

## 2017-02-06 NOTE — Telephone Encounter (Signed)
Spoke with patient, advised as seen below per Dr. Sabra Heck. Patient verbalizes understanding and is agreeable. Will close encounter.

## 2017-08-22 ENCOUNTER — Encounter: Payer: Self-pay | Admitting: Internal Medicine

## 2017-10-12 ENCOUNTER — Ambulatory Visit: Payer: BC Managed Care – PPO | Admitting: Podiatry

## 2017-10-12 DIAGNOSIS — Q828 Other specified congenital malformations of skin: Secondary | ICD-10-CM

## 2017-10-12 DIAGNOSIS — M779 Enthesopathy, unspecified: Secondary | ICD-10-CM

## 2017-10-12 DIAGNOSIS — M7752 Other enthesopathy of left foot: Secondary | ICD-10-CM | POA: Diagnosis not present

## 2017-10-14 NOTE — Progress Notes (Signed)
  Subjective:  Patient ID: [name], female    DOB: 08-Oct-1955,  MRN: [id number]  No chief complaint on file.  62 y.o. female returns for the above complaint.  Reports recurrent growth of the callus to the outside of the left foot about the fifth toe area.   Objective:  There were no vitals filed for this visit. General AA&O x3. Normal mood and affect.  Vascular Pedal pulses palpable.  Neurologic Epicritic sensation grossly intact.  Dermatologic No open lesions. Skin normal texture and turgor. Left fifth MPJ hyperkeratosis with punctate core hyperkeratosis left hallux medial IPJ with  Orthopedic: Pain palpation of the left fifth MPJ.   Assessment & Plan:  Patient was evaluated and treated and all questions answered.  Capsulitis 5th MPJ Left -Injection as below. -Porokeratosis debrided Discussed proper shoe gear-  Procedure: Joint Injection Location: Left 5th MPJ joint Skin Prep: Alcohol. Injectate: 0.5 cc 1% lidocaine plain, 0.5 cc dexamethasone phosphate. Disposition: Patient tolerated procedure well. Injection site dressed with a band-aid.    Return if symptoms worsen or fail to improve.

## 2018-04-16 NOTE — Progress Notes (Signed)
62 y.o. G2P0010 Married White or Caucasian female here for annual exam.  Doing well.  Denies vaginal bleeding.  Having a lot of issues with vaginal dryness.  She uses KY jelly but this doesn't help.      Having increased issues with fecal incontinence.  Wearing a pad all of the time right now.  Has episodes of very loose stools.  She is keeping notes to see if she can figure out the cause as Dr. Virgina Jock may consider starting medications.   She is planning on retiring in April.    PCP:  Dr. Virgina Jock.  Last appt was 11/19.  Last HbA1C was 7.8.  Goal is 7.0.    Patient's last menstrual period was 04/17/2000.          Sexually active: Yes.    The current method of family planning is status post hysterectomy.    Exercising: No.  The patient does not participate in regular exercise at present. Smoker:  no  Health Maintenance: Pap:  2011  Normal  History of abnormal Pap:  no MMG: 04/29/17.  Release will be signed today. Colonoscopy:  2009 normal.  Aware this is due.  Will have Dr. Virgina Jock refer her when she sees him in April.   BMD:   2009 TDaP:  2011 Pneumonia vaccine(s):  2012 Shingrix:   7/18 and 9/18  Hep C testing: 2018 neg per patient  Screening Labs: PCP, Hb today:PCP, Urine today:none   reports that she has never smoked. She has never used smokeless tobacco. She reports that she does not drink alcohol or use drugs.  Past Medical History:  Diagnosis Date  . Anxiety   . Asthma   . Diabetes mellitus ([place])   . DVT (deep venous thrombosis) ([place]) 1980  . GERD (gastroesophageal reflux disease)   . Hyperlipidemia   . Hypertriglyceridemia   . Liver hemangioma    right lobe  . Nephrolithiasis   . Panic disorder     Past Surgical History:  Procedure Laterality Date  . BREAST DUCTAL SYSTEM EXCISION Left 11/1997  . CHOLECYSTECTOMY  2005  . COMBINED HYSTEROSCOPY DIAGNOSTIC / D&C  1998  . excision of squamous cell Left 12/10/2016   Dr. Renda Rolls  . INCONTINENCE SURGERY  1999  . KNEE  ARTHROSCOPY Left 12/02  . KNEE ARTHROSCOPY Right 12/03  . OTHER SURGICAL HISTORY  11/13   Rotator Cuff [place]  . REPLACEMENT TOTAL KNEE Left 5/11  . REPLACEMENT TOTAL KNEE Right 11/12  . THYROIDECTOMY, PARTIAL Right 1994  . THYROIDECTOMY, PARTIAL Left 1996  . TOTAL ABDOMINAL HYSTERECTOMY  2002   ovaries remain    Current Outpatient Medications  Medication Sig Dispense Refill  . albuterol (PROVENTIL HFA;VENTOLIN HFA) 108 (90 BASE) MCG/ACT inhaler Inhale 2 puffs into the lungs every 6 (six) hours as needed. For shortness of breath     . B-D ULTRAFINE III SHORT PEN 31G X 8 MM MISC     . Biotin 1000 MCG tablet Take 5,000 mcg by mouth daily.     . Cholecalciferol (VITAMIN D PO) Take by mouth daily.    . Cyanocobalamin (VITAMIN B 12 PO) Take by mouth daily.    Marland Kitchen desonide (DESOWEN) 0.05 % ointment APPLY ON EYELIDS TWICE DAILY FOR 3 DAYS WHEN FLARED  1  . escitalopram (LEXAPRO) 20 MG tablet Take 20 mg by mouth daily.  5  . fenofibrate 160 MG tablet Take 160 mg by mouth at bedtime.      . fluconazole (DIFLUCAN) 150  MG tablet TAKE 1 TABLET (150 MG TOTAL) BY MOUTH ONCE. REPEAT IN 72 HOURS. 2 tablet 1  . HALOG 0.1 % OINT APPLY TO AFFECTED AREA 1-2 TIMES DAILY FOR 10 DAYS  0  . ibuprofen (ADVIL,MOTRIN) 100 MG tablet Take 800 mg by mouth every 8 (eight) hours as needed. For pain     . JARDIANCE 25 MG TABS tablet TAKE 1 TABLET BY MOUTH EVERY DAY OK TO REPLACE INVOKANA AFTER 04-18-15 BASED ON FORMULARY COVERAGE  11  . levothyroxine (SYNTHROID, LEVOTHROID) 150 MCG tablet Take 150 mcg by mouth daily.      Marland Kitchen losartan-hydrochlorothiazide (HYZAAR) 100-25 MG per tablet Take 1 tablet by mouth daily.      . metFORMIN (GLUCOPHAGE) 1000 MG tablet Take 1,000 mg by mouth 2 (two) times daily before lunch and supper.      Marland Kitchen NOVOLOG FLEXPEN 100 UNIT/ML FlexPen USE AS DIRECTED 2-3 TIMES DAILY WITH MEALS PER SLIDING SCALE  6  . nystatin (MYCOSTATIN/NYSTOP) powder Apply topically 2 (two) times daily. Apply to affected  area for up to 7 days 30 g 2  . nystatin cream (MYCOSTATIN) Apply 1 application topically 2 (two) times daily. 100,000U/gram use BID x 7days prn itching 30 g 2  . omega-3 acid ethyl esters (LOVAZA) 1 G capsule Take 2 g by mouth daily.      Marland Kitchen omeprazole (PRILOSEC) 20 MG capsule Take 20 mg by mouth every morning.      . ONE TOUCH ULTRA TEST test strip     . terconazole (TERAZOL 7) 0.4 % vaginal cream Place 1 applicator vaginally at bedtime. One applicator full QHS for seven days of therapy 45 g 1  . TRESIBA FLEXTOUCH 200 UNIT/ML SOPN INJECT 40 UNITS IN THE MORNING AND 60 UNITS IN THE EVENING  9   No current facility-administered medications for this visit.     Family History  Problem Relation Age of Onset  . Prostate cancer Father   . Pancreatic cancer Mother   . Uterine cancer Maternal Aunt     Review of Systems  Endocrine:       Pain and bleeding with intercourse.  Genitourinary: Positive for vaginal bleeding.       Diarrhea    Exam:   BP (!) 130/58   Pulse 63   Resp 14   Ht 5' 5.5" (1.664 m)   Wt 221 lb (100.2 kg)   LMP 04/17/2000   BMI 36.22 kg/m    Height: 5' 5.5" (166.4 cm)  Ht Readings from Last 3 Encounters:  04/19/18 5' 5.5" (1.664 m)  02/02/17 5' 5.25" (1.657 m)  11/05/15 5' 6.5" (1.689 m)    General appearance: alert, cooperative and appears stated age Head: Normocephalic, without obvious abnormality, atraumatic Neck: no adenopathy, supple, symmetrical, trachea midline and thyroid normal to inspection and palpation Lungs: clear to auscultation bilaterally Breasts: normal appearance, no masses or tenderness Heart: regular rate and rhythm Abdomen: soft, non-tender; bowel sounds normal; no masses,  no organomegaly Extremities: extremities normal, atraumatic, no cyanosis or edema Skin: Skin color, texture, turgor normal. No rashes or lesions Lymph nodes: Cervical, supraclavicular, and axillary nodes normal. No abnormal inguinal nodes palpated Neurologic:  Grossly normal   Pelvic: External genitalia:  no lesions              Urethra:  normal appearing urethra with no masses, tenderness or lesions              Bartholins and Skenes: normal  Vagina: normal appearing vagina with normal color, watery discharge noted today, no lesions              Cervix: absent              Pap taken: No. Bimanual Exam:  Uterus:  uterus absent              Adnexa: no mass, fullness, tenderness               Rectovaginal: Confirms               Anus:  Decreased anal sphincter tone, no lesions  Chaperone was present for exam.  A:  Well Woman with normal exam PMP, no HRT H/o DVT in her 20's DM Elevated triglycerides Hypothyroidism resulting from bilateral partial thyroidectomy Vaginal dryness Vaginal discharge noted today Fecal incontinence with decreased rectal tone on exam today  P:   Mammogram guidelines reviewed.  Doing 3D MMG pap smear not indicated Vit E vaginal suppositories 200u/ml, one pv three times weekly.  #[id number]/4RF will be sent to [place] Referral to [facility] for hopefully improvement in rectal tone Affirm pending return annually or prn

## 2018-04-19 ENCOUNTER — Ambulatory Visit (INDEPENDENT_AMBULATORY_CARE_PROVIDER_SITE_OTHER): Payer: BC Managed Care – PPO | Admitting: Obstetrics & Gynecology

## 2018-04-19 ENCOUNTER — Encounter

## 2018-04-19 VITALS — BP 130/58 | HR 63 | Resp 14 | Ht 65.5 in | Wt 221.0 lb

## 2018-04-19 DIAGNOSIS — R152 Fecal urgency: Secondary | ICD-10-CM

## 2018-04-19 DIAGNOSIS — R159 Full incontinence of feces: Secondary | ICD-10-CM

## 2018-04-19 DIAGNOSIS — Z01419 Encounter for gynecological examination (general) (routine) without abnormal findings: Secondary | ICD-10-CM | POA: Diagnosis not present

## 2018-04-19 DIAGNOSIS — N898 Other specified noninflammatory disorders of vagina: Secondary | ICD-10-CM

## 2018-04-19 MED ORDER — NONFORMULARY OR COMPOUNDED ITEM: 4 refills | Status: DC

## 2018-04-20 LAB — VAGINITIS/VAGINOSIS, DNA PROBE
CANDIDA SPECIES: NEGATIVE
Gardnerella vaginalis: NEGATIVE
TRICHOMONAS VAG: NEGATIVE

## 2018-04-22 ENCOUNTER — Encounter: Payer: Self-pay | Admitting: Obstetrics & Gynecology

## 2018-04-24 ENCOUNTER — Telehealth: Payer: Self-pay | Admitting: Obstetrics & Gynecology

## 2018-04-24 NOTE — Telephone Encounter (Signed)
Patient says she is returning a call to Sims. No open telephone message.

## 2018-04-24 NOTE — Telephone Encounter (Signed)
Reviewed with Estill Bamberg, no open telephone notes, no call to patient. Call returned to patient. Patient states she received a call regarding results and another call with no info, "return call to [place]". Patient has no questions or concerns regarding results.   Encounter closed.

## 2018-05-06 ENCOUNTER — Encounter: Payer: Self-pay | Admitting: Obstetrics & Gynecology

## 2018-12-03 ENCOUNTER — Other Ambulatory Visit: Payer: Self-pay | Admitting: Internal Medicine

## 2018-12-03 DIAGNOSIS — D1803 Hemangioma of intra-abdominal structures: Secondary | ICD-10-CM

## 2018-12-09 ENCOUNTER — Ambulatory Visit
Admission: RE | Admit: 2018-12-09 | Discharge: 2018-12-09 | Disposition: A | Discharge status: Home / Self Care | Payer: BC Managed Care – PPO | Source: Ambulatory Visit | Attending: Internal Medicine | Admitting: Internal Medicine

## 2018-12-09 DIAGNOSIS — D1803 Hemangioma of intra-abdominal structures: Secondary | ICD-10-CM

## 2018-12-09 IMAGING — US ULTRASOUND ABDOMEN LIMITED
1 series · 13 of 25 positions shown · non-contrast
Comparison: Ultrasound [DATE], [DATE].

CLINICAL DATA: Possible liver hemangioma follow-up.

EXAM:
ULTRASOUND ABDOMEN LIMITED RIGHT UPPER QUADRANT

[Series 1: ultrasound abdomen limited · 0.11mm/px · 13 of 47 slices shown]
[im 1/47]
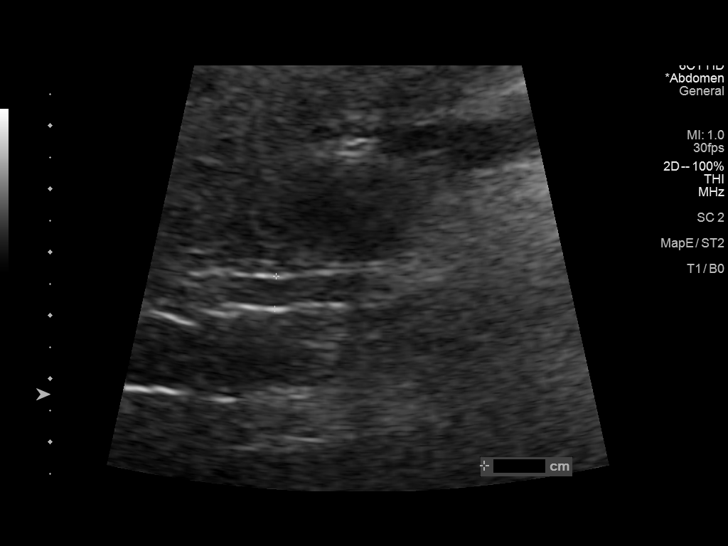
[im 4/47]
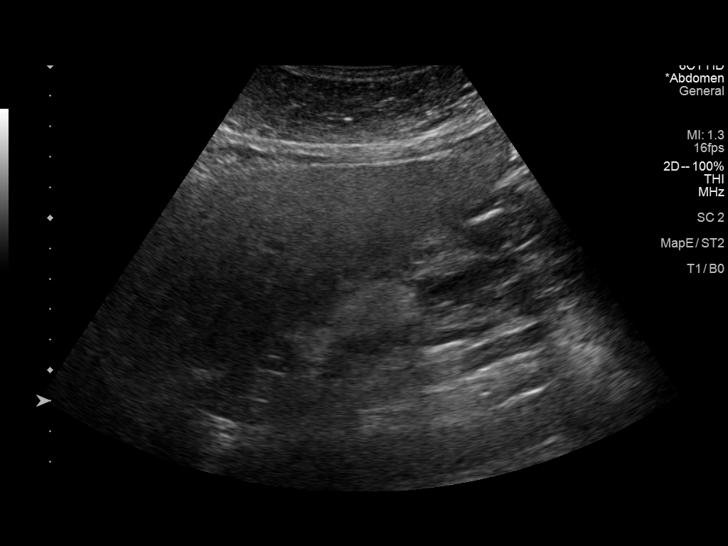
[im 8/47]
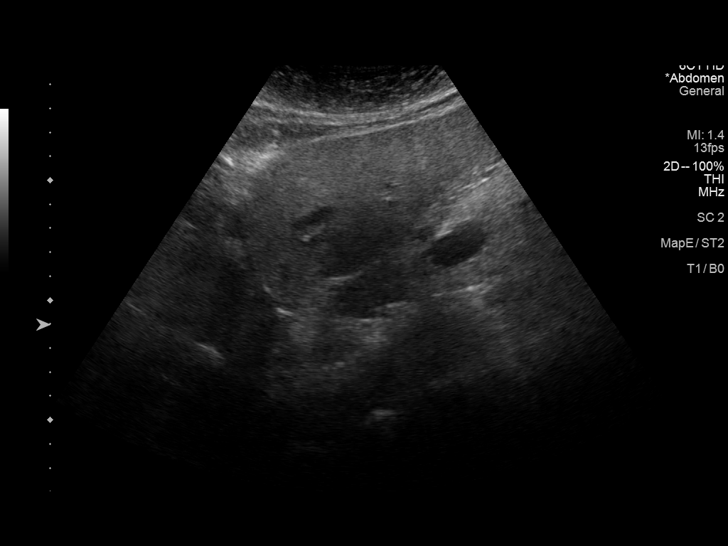
[im 12/47]
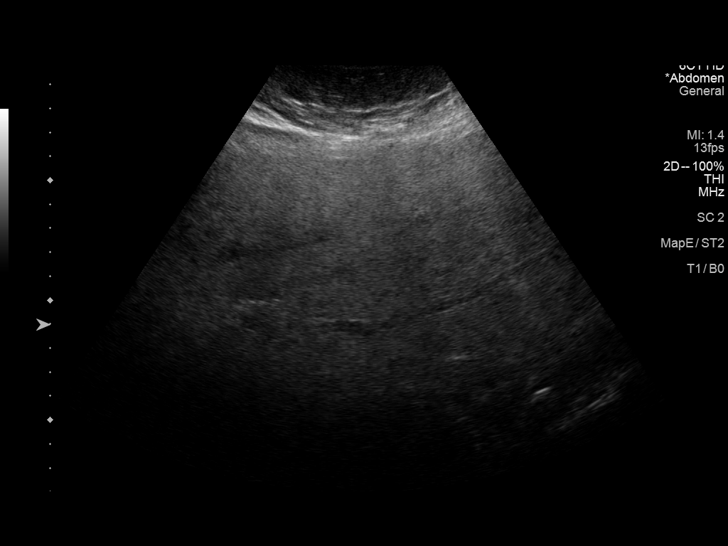
[im 16/47]
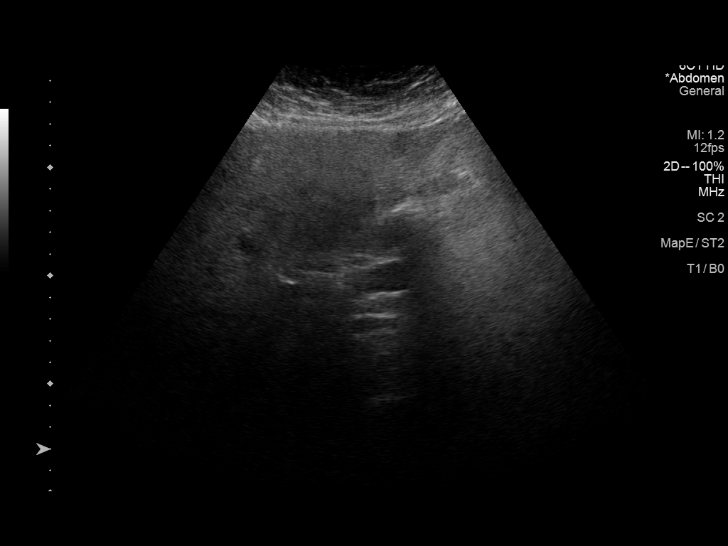
[im 20/47]
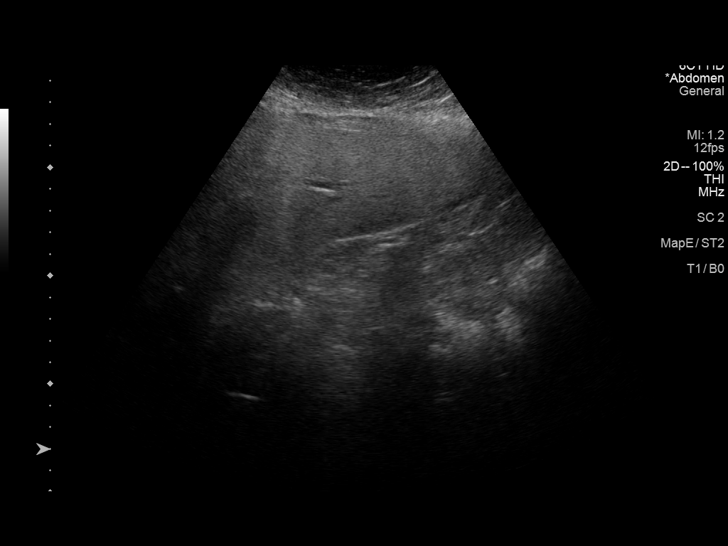
[im 24/47]
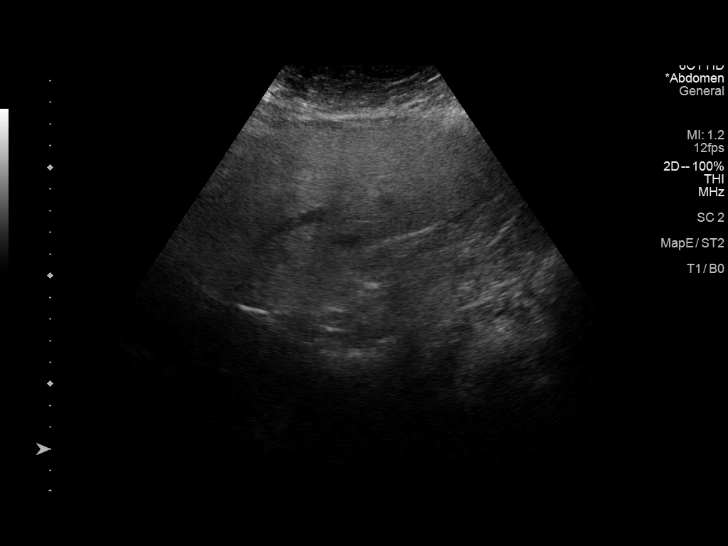
[im 27/47]
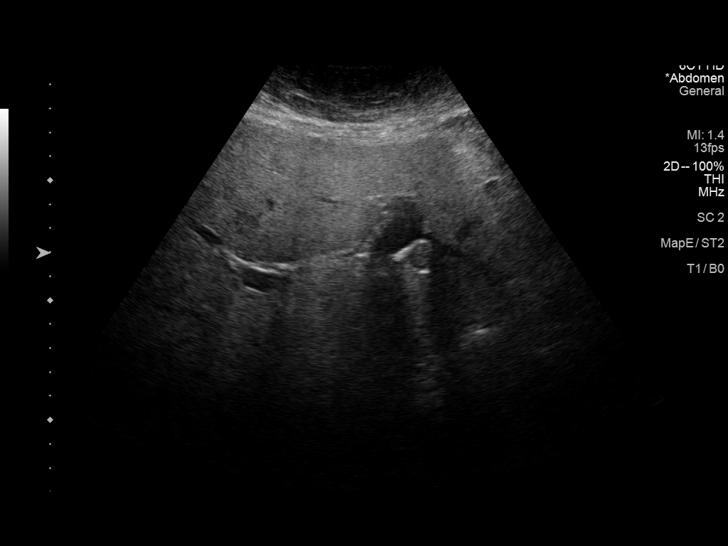
[im 31/47]
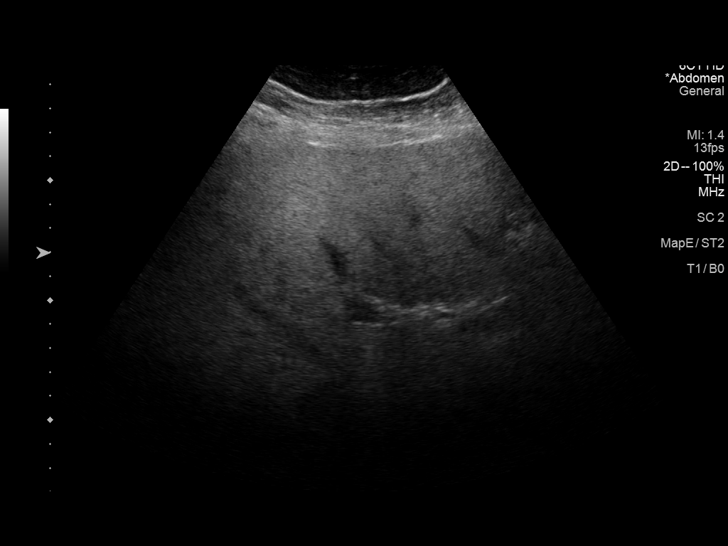
[im 35/47]
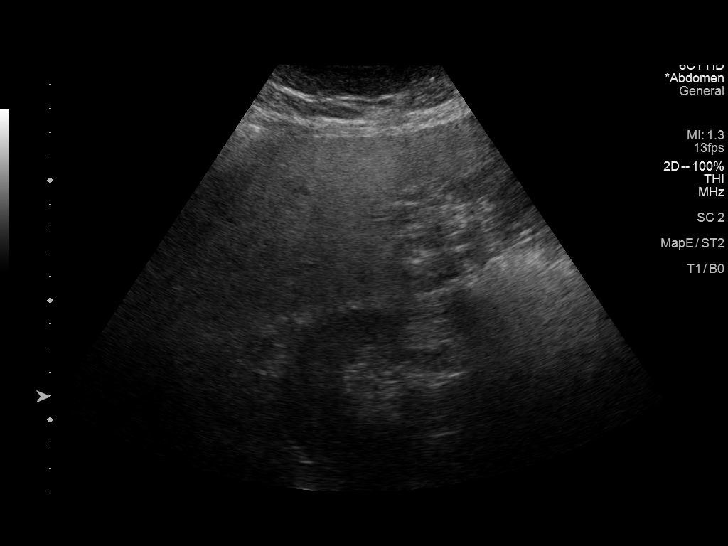
[im 39/47]
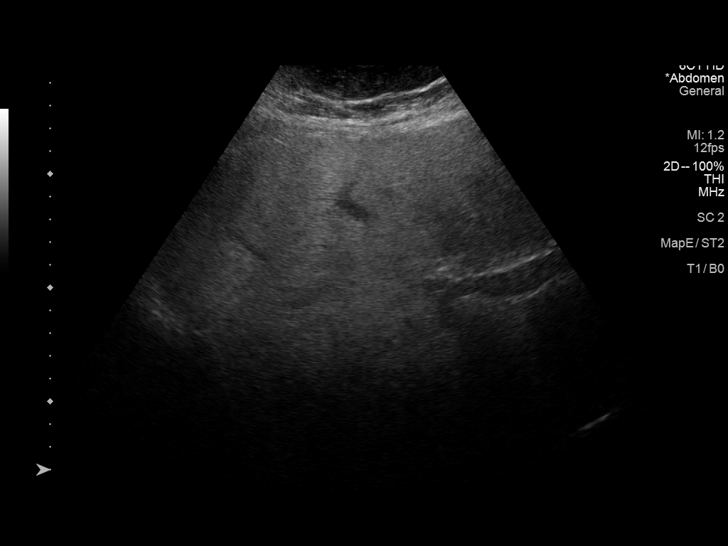
[im 43/47]
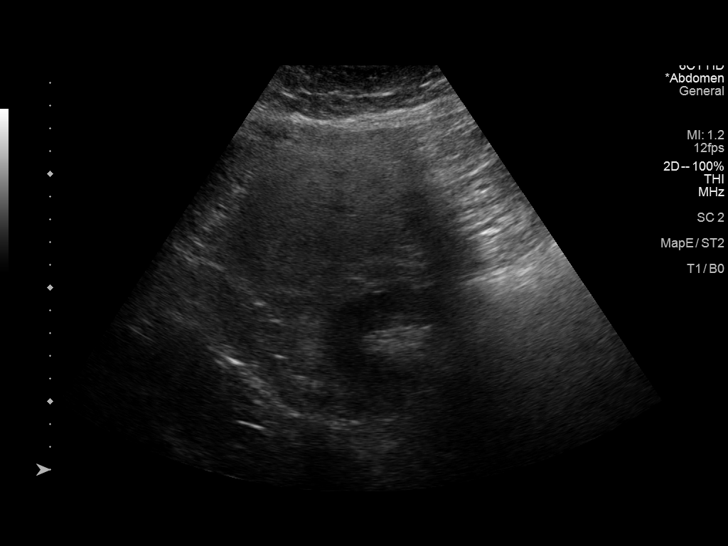
[im 47/47]
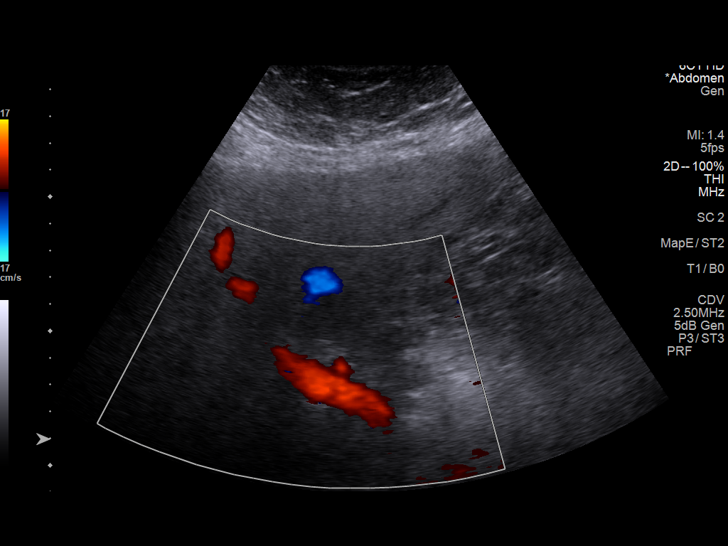

[13 of 25 positions shown; findings below may reference images not displayed]

FINDINGS: Gallbladder:

Cholecystectomy.

Common bile duct:

Diameter: 5.2 mm

Liver:

1.5 cm heterogeneous nodular density right hepatic lobe. This has a
different appearance to previously identified hypoechoic focus in
the right hepatic lobe which is not definitely identified on today's
exam as noted on prior ultrasound [DATE]. Although this could
represent a benign hemangioma, to further evaluate MRI of the liver
is suggested.

The liver parenchyma is very heterogeneous. A large area of
decreased echogenicity is noted in the left hepatic lobe. This is
most likely focal fatty sparing however to exclude a focal hepatic
mass in the left hepatic lobe MRI is suggested to further evaluate.

Portal vein is patent on color Doppler imaging with normal direction
of blood flow towards the liver.

Other: None.
IMPRESSION: 1.  Cholecystectomy.  No biliary distention.

2. 1.5 cm heterogeneous nodular density right hepatic lobe. This has
a different appearance to previously identified hypoechoic focus in
the right hepatic lobe which is not definitely identified on today's
exam as noted on prior ultrasound of [DATE].

The liver is very heterogeneous with a large area of decreased
echogenicity in the left hepatic lobe, most likely representing
focal fatty sparing. However focal left hepatic lobe mass cannot be
excluded.

MRI of the liver is suggested for further evaluation of these
findings.

## 2018-12-13 ENCOUNTER — Other Ambulatory Visit: Payer: Self-pay | Admitting: Internal Medicine

## 2018-12-13 DIAGNOSIS — K769 Liver disease, unspecified: Secondary | ICD-10-CM

## 2019-01-15 ENCOUNTER — Ambulatory Visit: Payer: BC Managed Care – PPO | Admitting: Cardiology

## 2019-01-15 ENCOUNTER — Encounter: Payer: Self-pay | Admitting: Cardiology

## 2019-01-15 ENCOUNTER — Other Ambulatory Visit: Payer: Self-pay

## 2019-01-15 VITALS — BP 142/77 | HR 92 | Temp 96.9°F | Ht 65.5 in | Wt 215.2 lb

## 2019-01-15 DIAGNOSIS — I10 Essential (primary) hypertension: Secondary | ICD-10-CM | POA: Diagnosis not present

## 2019-01-15 DIAGNOSIS — R9431 Abnormal electrocardiogram [ECG] [EKG]: Secondary | ICD-10-CM

## 2019-01-15 DIAGNOSIS — R072 Precordial pain: Secondary | ICD-10-CM | POA: Diagnosis not present

## 2019-01-15 DIAGNOSIS — Z794 Long term (current) use of insulin: Secondary | ICD-10-CM

## 2019-01-15 DIAGNOSIS — E781 Pure hyperglyceridemia: Secondary | ICD-10-CM | POA: Diagnosis not present

## 2019-01-15 DIAGNOSIS — R079 Chest pain, unspecified: Secondary | ICD-10-CM

## 2019-01-15 DIAGNOSIS — E119 Type 2 diabetes mellitus without complications: Secondary | ICD-10-CM

## 2019-01-15 DIAGNOSIS — K760 Fatty (change of) liver, not elsewhere classified: Secondary | ICD-10-CM

## 2019-01-15 MED ORDER — METOPROLOL TARTRATE 50 MG PO TABS: 100.0000 mg | ORAL_TABLET | Freq: Once | ORAL | 0 refills | Status: DC

## 2019-01-15 NOTE — Patient Instructions (Addendum)
Medication Instructions:   If you need a refill on your cardiac medications before your next appointment, please call your pharmacy.   Lab work:  If you have labs (blood work) drawn today and your tests are completely normal, you will receive your results only by: Marland Kitchen MyChart Message (if you have MyChart) OR . A paper copy in the mail If you have any lab test that is abnormal or we need to change your treatment, we will call you to review the results.  Testing/Procedures: WILL BE SCHEDULE AT [place] DEPT Your physician has requested that you have cardiac CT. Cardiac computed tomography (CT) is a painless test that uses an x-ray machine to take clear, detailed pictures of your heart. For further information please visit [email or web address]. Please follow instruction sheet as given.   Follow-Up: At [facility], you and your health needs are our priority.  As part of our continuing mission to provide you with exceptional heart care, we have created designated Provider Care Teams.  These Care Teams include your primary Cardiologist (physician) and Advanced Practice Providers (APPs -  Physician Assistants and Nurse Practitioners) who all work together to provide you with the care you need, when you need it. . You will need a follow up appointment in 2  months.  Please call our office 2 months in advance to schedule this appointment.  You may see Glenetta Hew, MD or one of the following Advanced Practice Providers on your designated Care Team:   . Rosaria Ferries, PA-C . Jory Sims, DNP, ANP  Any Other Special Instructions Will Be Listed Below (If Applicable).    .Your cardiac CT will be scheduled at one of the below locations:   [facility] [address] [place], [place] [address] [phone]  If scheduled at [facility], please arrive at the [facility] main entrance of [facility] 30-45 minutes prior to test start time. Proceed  to the [facility] Radiology Department (first floor) to check-in and test prep.   Please follow these instructions carefully (unless otherwise directed):    PLEASE HAVE  BMP  AT LEAST ONE WEEK PRIOR TO TEST   On the Night Before the Test: . Be sure to Drink plenty of water. . Do not consume any caffeinated/decaffeinated beverages or chocolate 12 hours prior to your test. . Do not take any antihistamines 12 hours prior to your test.  On the Day of the Test: . Drink plenty of water. Do not drink any water within one hour of the test. . Do not eat any food 4 hours prior to the test. . You may take your regular medications prior to the test.  . Take metoprolol (Lopressor) 100 MG  two hours prior to test. . HOLD Furosemide/Hydrochlorothiazide morning of the test. . FEMALES- please wear underwire-free bra if available       After the Test: . Drink plenty of water. . After receiving IV contrast, you may experience a mild flushed feeling. This is normal. . On occasion, you may experience a mild rash up to 24 hours after the test. This is not dangerous. If this occurs, you can take Benadryl 25 mg and increase your fluid intake. . If you experience trouble breathing, this can be serious. If it is severe call 911 IMMEDIATELY. If it is mild, please call our office. . If you take any of these medications: Glipizide/Metformin, Avandament, Glucavance, please do not take 48 hours after completing test unless otherwise  instructed.   Please contact the cardiac imaging nurse navigator should you have any questions/concerns Marchia Bond, RN Navigator Cardiac Imaging [place] [facility] [phone] Office  [phone] Cell

## 2019-01-15 NOTE — Progress Notes (Signed)
PCP: Shon Baton, MD  Clinic Note: Chief Complaint  Patient presents with  . New Patient (Initial Visit)    Atypical chest pain    HPI: [name] is a 63 y.o. female with cardiovascular risk factors of hypertension, hyperlipidemia and DM-2 with second-degree relatives having vascular disease.  She is being seen today for the evaluation of atypical chest pain and baseline cardiovascular evaluation at the request of Shon Baton, MD.  [name] was referred by Dr. Virgina Jock after her visit on November 29, 2018 when she was describing some atypical sounding chest discomfort episodes.  Recent Hospitalizations:   None  Studies Personally Reviewed - (if available, images/films reviewed: From Epic Chart or Care Everywhere)  Per her report, has had a stress test in the past that was negative.  Interval History: [name] presents here today for cardiovascular evaluation.  She is been having episodes of chest discomfort that she really describes as being, left upper chest neck and shoulder discomfort this been going on off and on since July.  The symptoms can occur with or without exertion, she first noticed it after playing tennis not while playing tennis.  She has been a little bit reluctant to do activities since this started hurting however.  She said that she described 1 episode where she felt like her bra was on too tight and had a little bit of shortness of breath associated with it. She is also been noticing episodes where her heart rate goes up fast with no explanation.  Oftentimes is in the heat or when she does increase exertion, but has noticed that sometimes at night when she lays down on the bed to sleep.  No routine chest pain or pressure/dyspnea with rest or exertion just these intermittent episodes that occur randomly with or without exertion.   No PND, orthopnea or edema.   Occasional rapid heart rates, but no irregular palpitations, lightheadedness, dizziness, weakness  or syncope/near syncope. No TIA/amaurosis fugax symptoms. No melena, hematochezia, hematuria, or epstaxis. No claudication.  ROS: A comprehensive was performed. Review of Systems  Constitutional: Negative for malaise/fatigue and weight loss.  HENT: Positive for hearing loss. Negative for congestion and nosebleeds.   Respiratory: Negative for cough and shortness of breath.   Cardiovascular:       Per HPI  Gastrointestinal: Negative for abdominal pain, blood in stool, constipation, diarrhea, heartburn and melena.  Genitourinary: Negative for hematuria.  Musculoskeletal: Positive for joint pain (Arthritis pains) and neck pain (Neck stiffness -on the left side, is seeing a chiropractor.).  Neurological: Negative for dizziness, tingling, focal weakness, weakness and headaches.  Psychiatric/Behavioral: Negative for depression and memory loss. The patient is not nervous/anxious (She is little bit anxious after her husband's MI but otherwise okay).     I have reviewed and (if needed) personally updated the patient's problem list, medications, allergies, past medical and surgical history, social and family history.   Past Medical History:  Diagnosis Date  . Anxiety   . Asthma   . Diabetes mellitus, type II, insulin dependent ([place])    On Jardiance, metformin and NovoLog sliding scale plus Tresiba  . DVT (deep venous thrombosis) ([place]) 1980  . Dyslipidemia    Hyperlipidemia, with hypertriglyceridemia-on fenofibrate and Lovaza  . GERD (gastroesophageal reflux disease)   . History of broken collarbone   . Hypertension   . Hypothyroidism    On pretty high-dose of Synthroid  . Liver hemangioma    right lobe  . Nephrolithiasis   .  Osteoarthritis   . Panic disorder   . Rosacea     Past Surgical History:  Procedure Laterality Date  . BREAST DUCTAL SYSTEM EXCISION Left 11/1997  . CHOLECYSTECTOMY  2005  . COMBINED HYSTEROSCOPY DIAGNOSTIC / D&C  1998  . excision of squamous cell Left  12/10/2016   Dr. Renda Rolls  . INCONTINENCE SURGERY  1999  . KIDNEY STONE SURGERY     Extraction  . KNEE ARTHROSCOPY Left 12/02  . KNEE ARTHROSCOPY Right 12/03  . OTHER SURGICAL HISTORY  11/13   Rotator Cuff Sugery  . REPLACEMENT TOTAL KNEE Left 5/11  . REPLACEMENT TOTAL KNEE Right 11/12  . ROTATOR CUFF REPAIR Right 02/2012  . THYROIDECTOMY, PARTIAL Right 1994  . THYROIDECTOMY, PARTIAL Left 1996  . TOTAL ABDOMINAL HYSTERECTOMY  2002   ovaries remain    Current Meds  Medication Sig  . albuterol (PROVENTIL HFA;VENTOLIN HFA) 108 (90 BASE) MCG/ACT inhaler Inhale 2 puffs into the lungs every 6 (six) hours as needed. For shortness of breath   . B Complex Vitamins (B COMPLEX-B12) TABS Take by mouth.  . B-D ULTRAFINE III SHORT PEN 31G X 8 MM MISC   . Cholecalciferol (VITAMIN D PO) Take by mouth daily.  . Cyanocobalamin (VITAMIN B 12 PO) Take by mouth daily.  Marland Kitchen desonide (DESOWEN) 0.05 % ointment APPLY ON EYELIDS TWICE DAILY FOR 3 DAYS WHEN FLARED  . escitalopram (LEXAPRO) 20 MG tablet Take 20 mg by mouth daily.  . fenofibrate 160 MG tablet Take 160 mg by mouth at bedtime.    Marland Kitchen HALOG 0.1 % OINT APPLY TO AFFECTED AREA 1-2 TIMES DAILY FOR 10 DAYS  . ibuprofen (ADVIL,MOTRIN) 100 MG tablet Take 800 mg by mouth every 8 (eight) hours as needed. For pain   . JARDIANCE 25 MG TABS tablet TAKE 1 TABLET BY MOUTH EVERY DAY OK TO REPLACE INVOKANA AFTER 04-18-15 BASED ON FORMULARY COVERAGE  . levothyroxine (SYNTHROID, LEVOTHROID) 150 MCG tablet Take 150 mcg by mouth daily.    Marland Kitchen losartan-hydrochlorothiazide (HYZAAR) 100-25 MG per tablet Take 1 tablet by mouth daily.    . metFORMIN (GLUCOPHAGE) 1000 MG tablet Take 1,000 mg by mouth 2 (two) times daily before lunch and supper.    . NONFORMULARY OR COMPOUNDED ITEM Vit E vaginal 200u/ml, one pv three times weekly.  Marland Kitchen NOVOLOG FLEXPEN 100 UNIT/ML FlexPen USE AS DIRECTED 2-3 TIMES DAILY WITH MEALS PER SLIDING SCALE  . nystatin cream (MYCOSTATIN) Apply 1  application topically 2 (two) times daily. 100,000U/gram use BID x 7days prn itching  . omega-3 acid ethyl esters (LOVAZA) 1 G capsule Take 2 g by mouth daily.    Marland Kitchen omeprazole (PRILOSEC) 20 MG capsule Take 20 mg by mouth every morning.    . ONE TOUCH ULTRA TEST test strip   . rosuvastatin (CRESTOR) 5 MG tablet TAKE 1 ON WED AND SUNDAY  . TRESIBA FLEXTOUCH 200 UNIT/ML SOPN INJECT 40 UNITS IN THE MORNING AND 60 UNITS IN THE EVENING  . [DISCONTINUED] Biotin 1000 MCG tablet Take 5,000 mcg by mouth daily.   . [DISCONTINUED] fluconazole (DIFLUCAN) 150 MG tablet TAKE 1 TABLET (150 MG TOTAL) BY MOUTH ONCE. REPEAT IN 72 HOURS.  . [DISCONTINUED] nystatin (MYCOSTATIN/NYSTOP) powder Apply topically 2 (two) times daily. Apply to affected area for up to 7 days  . [DISCONTINUED] terconazole (TERAZOL 7) 0.4 % vaginal cream Place 1 applicator vaginally at bedtime. One applicator full QHS for seven days of therapy    Allergies  Allergen Reactions  .  Latex Itching  . Adhesive [Tape] Rash  . Sulfa Antibiotics Rash    Social History   Tobacco Use  . Smoking status: Never Smoker  . Smokeless tobacco: Never Used  Substance Use Topics  . Alcohol use: No  . 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  Was a Environmental health practitioner.   She lives with her husband.   Husband was recently admitted for MI in April 2020 and underwent CABG.   No regular exercise.    family history includes Alzheimer's disease in her father; CAD in her paternal aunt; CVA in her paternal aunt; Dementia in her paternal uncle; Diabetes Mellitus II in her father; Hypertension in her father and sister; Nephrolithiasis in her father; Osteoarthritis in her mother; Pancreatic cancer in her mother; Prostate cancer in her father; Ulcers in her sister; Uterine cancer in her maternal aunt; Vascular Disease in her paternal uncle.  Wt Readings from Last 3 Encounters:  01/15/19 215 lb  3.2 oz (97.6 kg)  04/19/18 221 lb (100.2 kg)  02/02/17 218 lb (98.9 kg)    PHYSICAL EXAM BP (!) 142/77   Pulse 92   Temp (!) 96.9 F (36.1 C)   Ht 5' 5.5" (1.664 m)   Wt 215 lb 3.2 oz (97.6 kg)   LMP 04/17/2000   SpO2 96%   BMI 35.27 kg/m  Physical Exam  Constitutional: She is oriented to person, place, and time. She appears well-developed and well-nourished. No distress.  Moderately obese, well-groomed.  Healthy-appearing  HENT:  Head: Normocephalic and atraumatic.  Eyes: Pupils are equal, round, and reactive to light. Conjunctivae and EOM are normal.  Neck: Normal range of motion. No hepatojugular reflux and no JVD present. Carotid bruit is not present.  A little bit stiff, and sore on the left side  Cardiovascular: Normal rate, regular rhythm, normal heart sounds, intact distal pulses and normal pulses.  No extrasystoles are present. PMI is not displaced. Exam reveals no gallop and no friction rub.  No murmur heard. Pulmonary/Chest: Effort normal and breath sounds normal. No respiratory distress. She has no wheezes. She has no rales. She exhibits no tenderness.  Abdominal: Soft. Bowel sounds are normal. She exhibits no distension. There is no abdominal tenderness. There is no rebound.  Musculoskeletal: Normal range of motion.        General: No edema.  Neurological: She is alert and oriented to person, place, and time. No cranial nerve deficit.  Skin: Skin is warm and dry.  Psychiatric: She has a normal mood and affect. Her behavior is normal. Judgment and thought content normal.  Vitals reviewed.    Adult ECG Report  Rate: 86 ;  Rhythm: normal sinus rhythm and Left axis deviation/LAFB (-56).  T wave inversions in the I and aVL, cannot exclude lateral ischemia.  Constant septal MI, age undetermined.  Borderline incomplete right bundle much block..;   Narrative Interpretation: Abnormal EKG.   Other studies Reviewed: Additional studies/ records that were reviewed today  include:  Recent Labs:   November 21, 2018  TC 148, TG 304, HDL 37, LDL 50.  A1c 7.8.  Hgb 15.5.  CR 0.6.  TSH 1.32.  ASSESSMENT / PLAN: Problem List Items Addressed This Visit    Type 2 diabetes mellitus without complication, with long-term current use of insulin (HCC) (Chronic)    Aggressively managed by PCP.  A1c is 7.8.  She is on Jardiance now along  with insulin and metformin.  This makes up third part of metabolic syndrome.  Increases her overall cardiovascular risk.  Warrants detailed baseline cardiovascular evaluation.  Plan: Coronary CT angiogram with possible CT FFR.      Relevant Medications   rosuvastatin (CRESTOR) 5 MG tablet   Fatty liver disease, nonalcoholic (Chronic)   Hypertension (Chronic)    Blood pressure is relatively well controlled on combination HCTZ-losartan.  May recommend further titration of this existing medication versus potentially adding beta-blocker depending on what the coronary CTA shows.      Relevant Medications   rosuvastatin (CRESTOR) 5 MG tablet   Hypertriglyceridemia (Chronic)    Still relatively high triglycerides which goes along with fatty liver disease and diabetes.  Is on Lovaza and fenofibrate, and low-dose rosuvastatin.  Could consider switching from Lovaza to Vascepa      Relevant Medications   rosuvastatin (CRESTOR) 5 MG tablet   Other Relevant Orders   CT CORONARY MORPH W/CTA COR W/SCORE W/CA W/CM &/OR WO/CM   CT CORONARY FRACTIONAL FLOW RESERVE DATA PREP   CT CORONARY FRACTIONAL FLOW RESERVE FLUID ANALYSIS   Basic metabolic panel   Abnormal finding on EKG    Left anterior fascicular block with some T wave inversions that could be suggestive of ischemia in a patient with risk factors of hypertension, hyperlipidemia and diabetes type 2.  Clearly warrants evaluation for ischemia.  We talked about stress test versus other testing options.  My recommendation in order to get a baseline cardiac evaluation will be due to coronary  calcium score and coronary CT angiogram which will tell us both anatomy and physiology because if there is a potential abnormal lesion, this can be evaluated with CT FFR.  Plan: Coronary calcium score with coronary CT angiogram- CT FFR.      Relevant Orders   EKG 12-Lead (Completed)   CT CORONARY MORPH W/CTA COR W/SCORE W/CA W/CM &/OR WO/CM   CT CORONARY FRACTIONAL FLOW RESERVE DATA PREP   CT CORONARY FRACTIONAL FLOW RESERVE FLUID ANALYSIS   Basic metabolic panel   Chest pain with moderate risk for cardiac etiology - Primary    Unusual sounding chest pain symptoms, but initially came about with exertion.  My suspicion is it probably is musculoskeletal related to her neck pain, however with her significant vascular risk factors, secondary relatives with vascular disease and an abnormal EKG, I think is best to get a baseline cardiovascular evaluation. Nuclear stress test would only tell us if there is or is not a lesion greater than 70%.  However, coronary CT angiogram would show [place] evidence of existing CAD even if is not occlusive. If there is a significant lesion, it would be evaluated with CT FFR.  This gives [place] both anatomic data as well as physiologic data.  Plan: Coronary CT angiogram with possible CT FFR.      Relevant Orders   EKG 12-Lead (Completed)   CT CORONARY MORPH W/CTA COR W/SCORE W/CA W/CM &/OR WO/CM   CT CORONARY FRACTIONAL FLOW RESERVE DATA PREP   CT CORONARY FRACTIONAL FLOW RESERVE FLUID ANALYSIS   Basic metabolic panel    Other Visit Diagnoses    Precordial pain       Relevant Orders   EKG 12-Lead (Completed)   CT CORONARY MORPH W/CTA COR W/SCORE W/CA W/CM &/OR WO/CM   CT CORONARY FRACTIONAL FLOW RESERVE DATA PREP   CT CORONARY FRACTIONAL FLOW RESERVE FLUID ANALYSIS       I spent a total of 30  minutes with the patient and chart review. >  50% of the time was spent in direct patient consultation.   Current medicines are reviewed at length with the patient  today.  (+/- concerns) n/a The following changes have been made:  n/a  Patient Instructions  Medication Instructions:   If you need a refill on your cardiac medications before your next appointment, please call your pharmacy.   Lab work:  If you have labs (blood work) drawn today and your tests are completely normal, you will receive your results only by: Marland Kitchen MyChart Message (if you have MyChart) OR . A paper copy in the mail If you have any lab test that is abnormal or we need to change your treatment, we will call you to review the results.  Testing/Procedures: WILL BE SCHEDULE AT [facility] DEPT Your physician has requested that you have cardiac CT. Cardiac computed tomography (CT) is a painless test that uses an x-ray machine to take clear, detailed pictures of your heart. For further information please visit [email or web address]. Please follow instruction sheet as given.   Follow-Up: At [facility], you and your health needs are our priority.  As part of our continuing mission to provide you with exceptional heart care, we have created designated Provider Care Teams.  These Care Teams include your primary Cardiologist (physician) and Advanced Practice Providers (APPs -  Physician Assistants and Nurse Practitioners) who all work together to provide you with the care you need, when you need it. . You will need a follow up appointment in 2  months.  Please call our office 2 months in advance to schedule this appointment.  You may see Glenetta Hew, MD or one of the following Advanced Practice Providers on your designated Care Team:   . Rosaria Ferries, PA-C . Jory Sims, DNP, ANP  Any Other Special Instructions Will Be Listed Below (If Applicable).    .Your cardiac CT will be scheduled at one of the below locations:   [facility] [address] [place], [place] [address] [phone]  If scheduled at [facility], please arrive at the  [facility] main entrance of [facility] 30-45 minutes prior to test start time. Proceed to the [facility] Radiology Department (first floor) to check-in and test prep.   Please follow these instructions carefully (unless otherwise directed):    PLEASE HAVE  BMP  AT LEAST ONE WEEK PRIOR TO TEST   On the Night Before the Test: . Be sure to Drink plenty of water. . Do not consume any caffeinated/decaffeinated beverages or chocolate 12 hours prior to your test. . Do not take any antihistamines 12 hours prior to your test.  On the Day of the Test: . Drink plenty of water. Do not drink any water within one hour of the test. . Do not eat any food 4 hours prior to the test. . You may take your regular medications prior to the test.  . Take metoprolol (Lopressor) 100 MG  two hours prior to test. . HOLD Furosemide/Hydrochlorothiazide morning of the test. . FEMALES- please wear underwire-free bra if available       After the Test: . Drink plenty of water. . After receiving IV contrast, you may experience a mild flushed feeling. This is normal. . On occasion, you may experience a mild rash up to 24 hours after the test. This is not dangerous. If this occurs, you can take Benadryl 25 mg and increase your fluid  intake. . If you experience trouble breathing, this can be serious. If it is severe call 911 IMMEDIATELY. If it is mild, please call our office. . If you take any of these medications: Glipizide/Metformin, Avandament, Glucavance, please do not take 48 hours after completing test unless otherwise instructed.   Please contact the cardiac imaging nurse navigator should you have any questions/concerns Marchia Bond, RN Navigator Cardiac Imaging [facility] [phone] Office  [phone] Cell     Studies Ordered:   Orders Placed This Encounter  Procedures  . CT CORONARY MORPH W/CTA COR W/SCORE W/CA W/CM &/OR WO/CM  . CT CORONARY FRACTIONAL FLOW  RESERVE DATA PREP  . CT CORONARY FRACTIONAL FLOW RESERVE FLUID ANALYSIS  . Basic metabolic panel  . EKG 12-Lead      Glenetta Hew, M.D., M.S. Interventional Cardiologist   Pager # [phone] Phone # [phone] [address] [place], [place] [address]   Thank you for choosing Heartcare at [facility]!!

## 2019-01-16 ENCOUNTER — Other Ambulatory Visit: Payer: BC Managed Care – PPO

## 2019-01-19 ENCOUNTER — Encounter: Payer: Self-pay | Admitting: Cardiology

## 2019-01-20 ENCOUNTER — Other Ambulatory Visit: Payer: Self-pay

## 2019-01-20 ENCOUNTER — Ambulatory Visit
Admission: RE | Admit: 2019-01-20 | Discharge: 2019-01-20 | Disposition: A | Discharge status: Home / Self Care | Payer: BC Managed Care – PPO | Source: Ambulatory Visit | Attending: Internal Medicine | Admitting: Internal Medicine

## 2019-01-20 DIAGNOSIS — K769 Liver disease, unspecified: Secondary | ICD-10-CM

## 2019-01-20 IMAGING — MR MR ABDOMEN WO/W CM
11 of 17 series · 28 of 48 positions shown · IV contrast (multihance)
Comparison: Ultrasound exam [DATE]

CLINICAL DATA: 1.5 cm heterogeneous nodular lesion in the right
hepatic lobe on recent ultrasound.

EXAM:
MRI ABDOMEN WITHOUT AND WITH CONTRAST
TECHNIQUE: Multiplanar multisequence MR imaging of the abdomen was performed
both before and after the administration of intravenous contrast.
CONTRAST:  20mL MULTIHANCE GADOBENATE DIMEGLUMINE 529 MG/ML IV SOLN

[Series 3: T2 · coronal · 5.0mm · 1.56mm/px · 2 of 30 slices shown (1 of 3)]
[im 1/30]
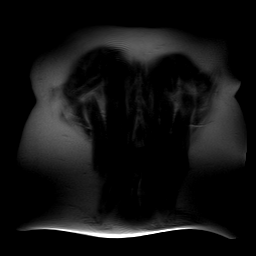
[im 30/30]
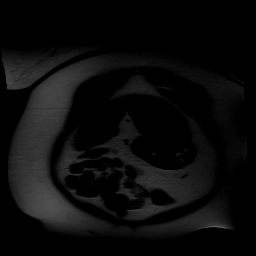

[Series 4: axial tru fisp · axial · 5.5mm · 1.48mm/px · z∈[-60,+168]mm · 2 of 37 slices shown]
[im 1/37]
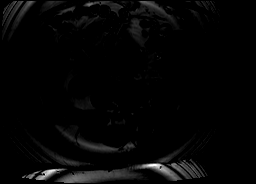
[im 37/37]
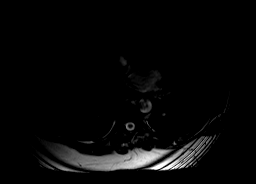

[Series 5: axial in out · axial · 6.0mm · 0.74mm/px · z∈[-93,+176]mm · 4 of 80 slices shown]
[im 1/80]
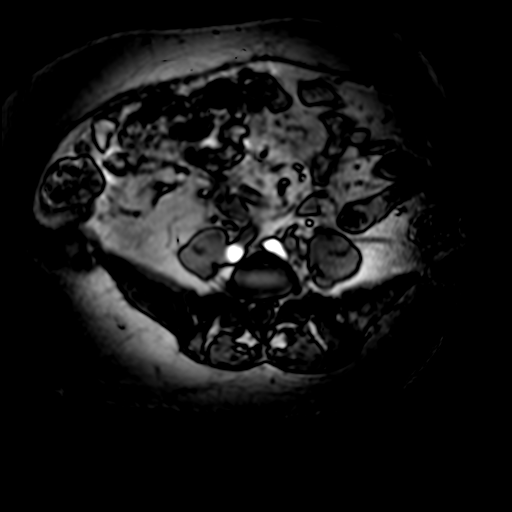
[im 27/80]
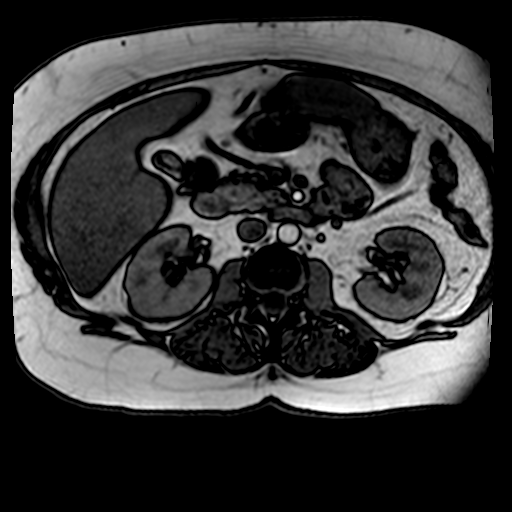
[im 53/80]
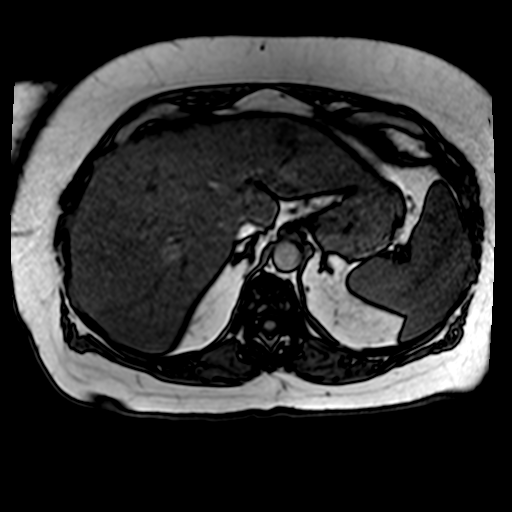
[im 80/80]
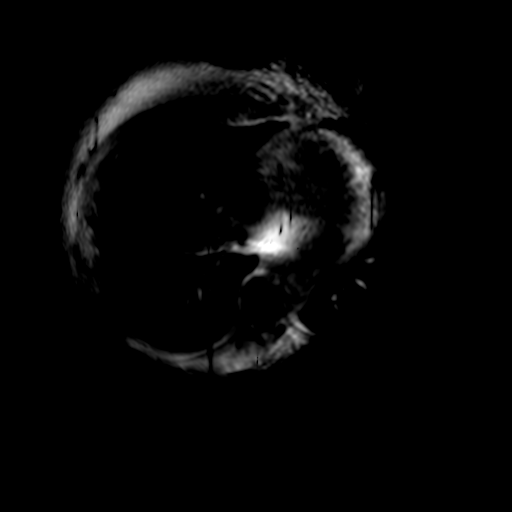

[Series 6: T2 · axial · 5.0mm · 1.48mm/px · z∈[-93,+174]mm · 3 of 42 slices shown (2 of 3)]
[im 1/42]
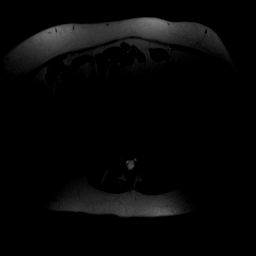
[im 21/42]
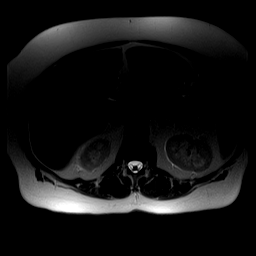
[im 42/42]
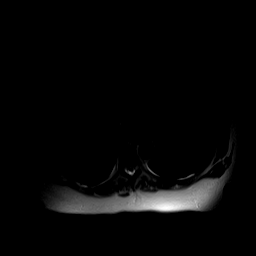

[Series 7: T2 · axial · 6.5mm · 0.74mm/px · z∈[-81,+223]mm · 2 of 40 slices shown (3 of 3)]
[im 1/40]
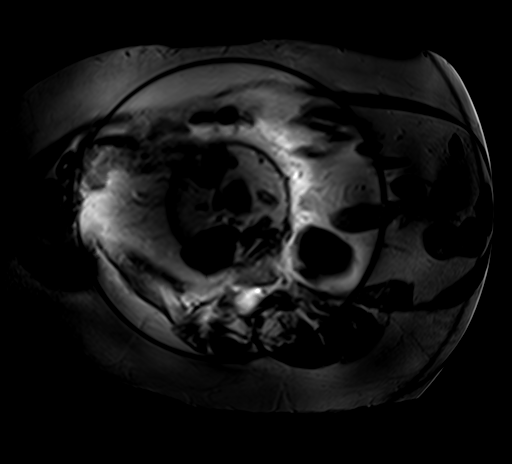
[im 40/40]
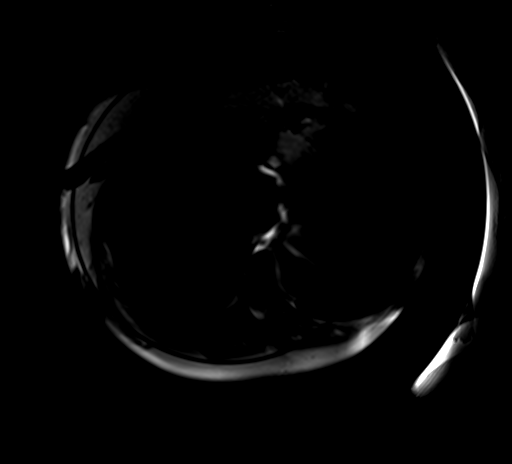

[Series 8: ep2d_[id number]_500_800_p2 · axial · 6.0mm · 1.98mm/px · z∈[-81,+223]mm · 4 of 119 slices shown]
[im 1/119]
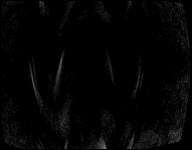
[im 40/119]
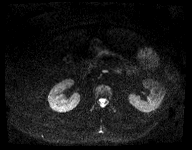
[im 79/119]
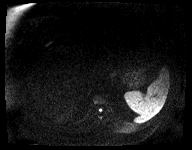
[im 119/119]
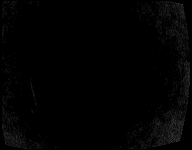

[Series 9: [id number]_adc · axial · 6.0mm · 1.98mm/px · 1 of 40 slices shown]
[im 1/40]
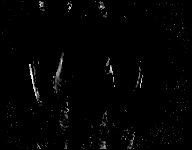

[Series 10: T1 dynamic · axial · non-contrast · 2.5mm · 0.78mm/px · z∈[-51,+186]mm · 3 of 96 slices shown]
[im 1/96]
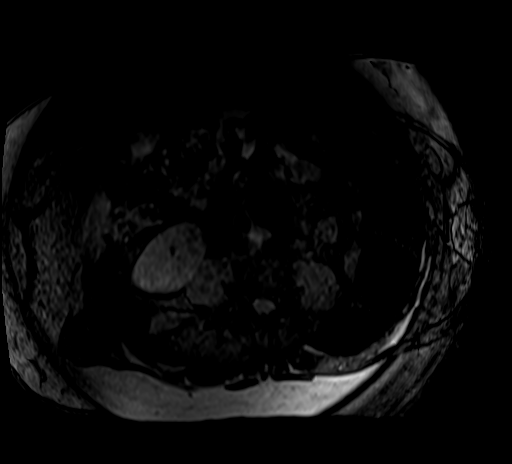
[im 48/96]
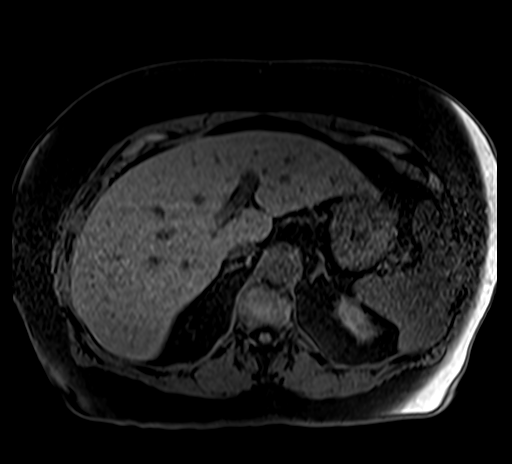
[im 96/96]
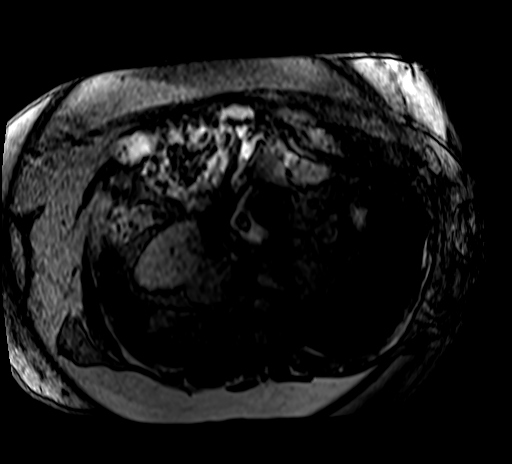

[Series 11: post 25 sec · axial · 2.5mm · 0.78mm/px · z∈[-51,+186]mm · 3 of 96 slices shown]
[im 1/96]
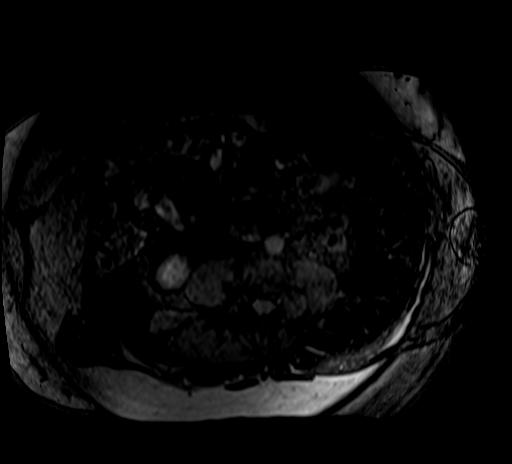
[im 48/96]
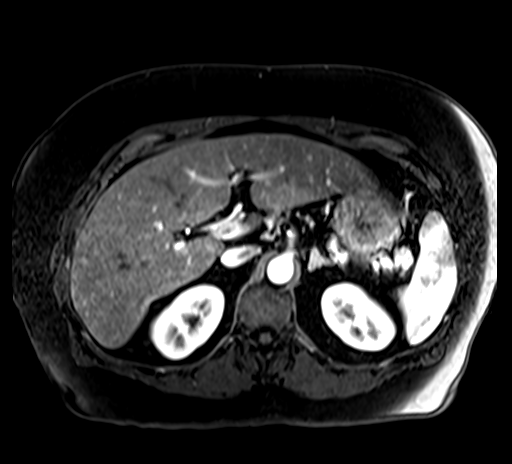
[im 96/96]
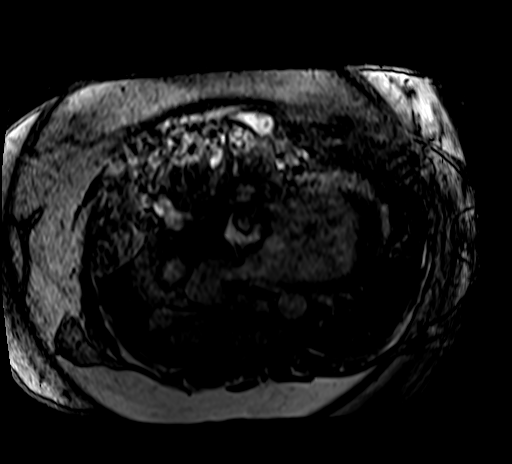

[Series 12: post 25 sec_sub · axial · 2.5mm · 0.78mm/px · z∈[-51,+186]mm · 3 of 96 slices shown]
[im 1/96]
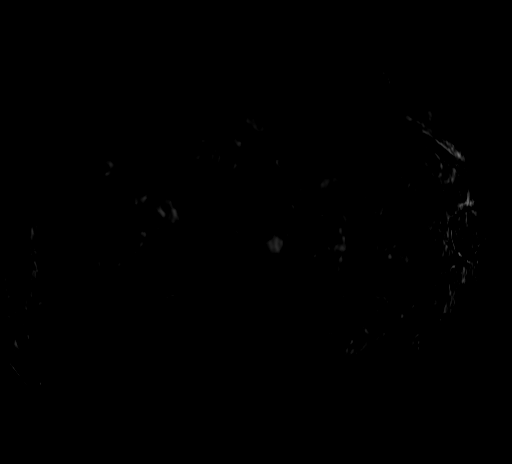
[im 48/96]
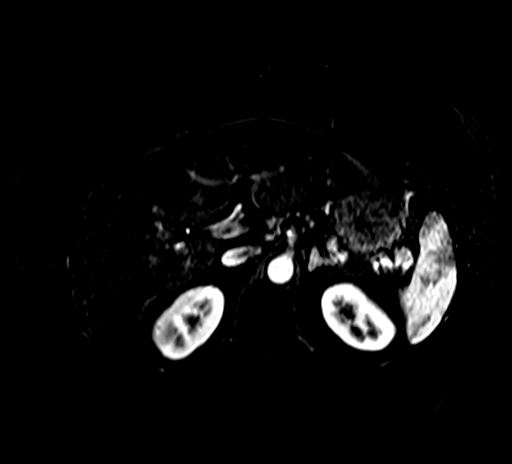
[im 96/96]
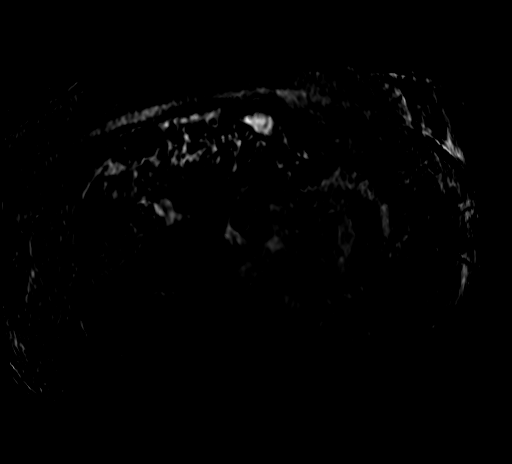

[Series 13: post 45 sec · axial · 2.5mm · 0.78mm/px · 1 of 96 slices shown]
[im 1/96]
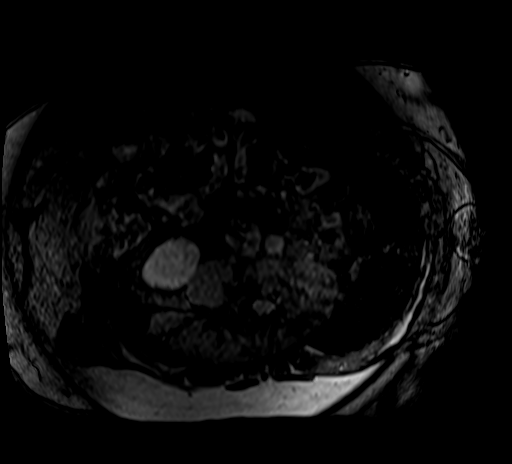

[28 of 48 positions shown; findings below may reference images not displayed]

FINDINGS: Lower chest: Unremarkable.

Hepatobiliary: Liver measures 25 cm craniocaudal length, enlarged.
16 mm lesion is identified in the posterior right liver (segment
VIII) corresponding to the abnormality seen on recent ultrasound.
This lesion is isointense to liver on precontrast T1 and T2 imaging,
shows diffuse avid enhancement on early arterial phase postcontrast
imaging (33/11) and rapidly washes out to become almost isointense
to liver parenchyma on more delayed postcontrast series. Imaging
features are most suggestive of focal nodular hyperplasia.

3 additional tiny hypervascular lesions are seen in the liver on
[DATE] second arterial phase postcontrast imaging. These include a 5
mm right hepatic lobe lesion on [DATE], a 10 mm left hepatic lobe
lesion on 45/11 and a 3 mm left hepatic lesion on 53/11. None of
these 3 lesions are visible on precontrast imaging and all 3 washout
rapidly to near background hepatic parenchymal enhancement levels on
more delayed postcontrast imaging.

Gallbladder is surgically absent. No intrahepatic or extrahepatic
biliary dilation.

Pancreas: No focal mass lesion. No dilatation of the main duct. No
intraparenchymal cyst. No peripancreatic edema. 4 mm simple cystic
lesion identified in the tip of the pancreatic tail (axial T2 haste
image [DATE]).

Spleen:  No splenomegaly. No focal mass lesion.

Adrenals/Urinary Tract: No adrenal nodule or mass. Tiny T2
hyperintense lesions are identified in both kidneys although these
are incompletely visualize as postcontrast imaging does not include
the extreme lower pole the left kidney. Imaging features are most
suggestive of benign etiology such as tiny simple cyst.

Stomach/Bowel: Stomach is unremarkable. No gastric wall thickening.
No evidence of outlet obstruction. Duodenum is normally positioned
as is the ligament of Treitz. No small bowel or colonic dilatation
within the visualized abdomen.

Vascular/Lymphatic: No abdominal aortic aneurysm. No abdominal
lymphadenopathy.

Other:  No intraperitoneal free fluid.

Musculoskeletal: No abnormal marrow enhancement within the
visualized bony anatomy.
IMPRESSION: 1. 16 mm lesion identified in the posterior right liver today
corresponds to the abnormality seen on recent ultrasound exam.
Imaging features are most suggestive of focal nodular hyperplasia.
Follow-up MRI in 3-6 months could be used to ensure stability.
2. 3 additional tiny hypervascular lesions are identified in the
liver parenchyma, also most in keeping with FNH. These could be
reassessed at the time of follow-up MRI.
3. 4 mm simple cyst identified in the tip of the pancreatic tail. In
a patient of this age with a cystic lesion of this size, consensus
guidelines recommend repeat MRI in 12 months. This recommendation
follows ACR consensus guidelines: Management of Incidental
Pancreatic Cysts: A White Paper of the ACR Incidental Findings
Committee. [HOSPITAL] [PV];[DATE].
4. Tiny bilateral renal cysts.
5. Hepatomegaly.

## 2019-01-20 MED ORDER — GADOBENATE DIMEGLUMINE 529 MG/ML IV SOLN
20.0000 mL | Freq: Once | INTRAVENOUS | Status: AC | PRN
  Administered 2019-01-20: 14:00:00 20 mL via INTRAVENOUS

## 2019-01-20 NOTE — Assessment & Plan Note (Signed)
Still relatively high triglycerides which goes along with fatty liver disease and diabetes.  Is on Lovaza and fenofibrate, and low-dose rosuvastatin.  Could consider switching from Lovaza to [facility]

## 2019-01-20 NOTE — Assessment & Plan Note (Signed)
Blood pressure is relatively well controlled on combination HCTZ-losartan.  May recommend further titration of this existing medication versus potentially adding beta-blocker depending on what the coronary CTA shows.

## 2019-01-20 NOTE — Assessment & Plan Note (Signed)
Left anterior fascicular block with some T wave inversions that could be suggestive of ischemia in a patient with risk factors of hypertension, hyperlipidemia and diabetes type 2.  Clearly warrants evaluation for ischemia.  We talked about stress test versus other testing options.  My recommendation in order to get a baseline cardiac evaluation will be due to coronary calcium score and coronary CT angiogram which will tell us both anatomy and physiology because if there is a potential abnormal lesion, this can be evaluated with CT FFR.  Plan: Coronary calcium score with coronary CT angiogram- CT FFR.

## 2019-01-20 NOTE — Assessment & Plan Note (Signed)
Aggressively managed by PCP.  A1c is 7.8.  She is on Jardiance now along with insulin and metformin.  This makes up third part of metabolic syndrome.  Increases her overall cardiovascular risk.  Warrants detailed baseline cardiovascular evaluation.  Plan: Coronary CT angiogram with possible CT FFR.

## 2019-01-20 NOTE — Assessment & Plan Note (Signed)
Unusual sounding chest pain symptoms, but initially came about with exertion.  My suspicion is it probably is musculoskeletal related to her neck pain, however with her significant vascular risk factors, secondary relatives with vascular disease and an abnormal EKG, I think is best to get a baseline cardiovascular evaluation. Nuclear stress test would only tell us if there is or is not a lesion greater than 70%.  However, coronary CT angiogram would show [place] evidence of existing CAD even if is not occlusive. If there is a significant lesion, it would be evaluated with CT FFR.  This gives [place] both anatomic data as well as physiologic data.  Plan: Coronary CT angiogram with possible CT FFR.

## 2019-02-25 ENCOUNTER — Telehealth: Payer: Self-pay | Admitting: Cardiology

## 2019-02-25 NOTE — Telephone Encounter (Signed)
New Message    Pt is calling and is wondering when she can have her CT scheduled     Please call

## 2019-02-25 NOTE — Telephone Encounter (Signed)
Called and LVM advising patient that the hospital will call to schedule the CT- I advised it was ordered, and it does take time to get scheduled, and providers are aware of this wait.  Gave call back number if any other questions.

## 2019-03-18 ENCOUNTER — Ambulatory Visit: Payer: BC Managed Care – PPO | Admitting: Cardiology

## 2019-06-13 ENCOUNTER — Telehealth: Payer: Self-pay | Admitting: Obstetrics & Gynecology

## 2019-06-13 DIAGNOSIS — N898 Other specified noninflammatory disorders of vagina: Secondary | ICD-10-CM

## 2019-06-13 NOTE — Telephone Encounter (Signed)
Patient has an external yeast infection that she would like to speak with nurse about getting a prescription for.

## 2019-06-13 NOTE — Telephone Encounter (Signed)
Left detailed message for pt to call back to triage RN. Pt instructed if not calling back before office closed to try 3 day Monistat over the weekend for yeast sx and give call back to office on Monday with update.

## 2019-06-16 NOTE — Telephone Encounter (Signed)
Spoke to pt. Pt states not using Monistat over weekend due to having diabetes.. Pt states having external itching x 1 month.. Denies discharge and odor. Pt requests Diflucan Rx for itching. Pt declines OV at this time because not vaccinated yet with Covid vaccine and not wanting to go out. Pt states will give a few more days to see "if calms down" and will return call to office if needs to be seen. Pt has   Rx for Diflucan pended if approved.   Routing to Dr Sabra Heck

## 2019-06-18 MED ORDER — FLUCONAZOLE 150 MG PO TABS: 150.0000 mg | ORAL_TABLET | Freq: Once | ORAL | 0 refills | Status: AC

## 2019-06-18 NOTE — Telephone Encounter (Signed)
Patient notified of RX.   Encounter closed.

## 2019-06-18 NOTE — Telephone Encounter (Signed)
Rx completed.  Ok to close encounter.   °

## 2019-07-23 ENCOUNTER — Other Ambulatory Visit: Payer: Self-pay | Admitting: Radiology

## 2019-07-23 ENCOUNTER — Other Ambulatory Visit: Payer: Self-pay | Admitting: Internal Medicine

## 2019-07-30 ENCOUNTER — Other Ambulatory Visit: Payer: Self-pay | Admitting: Internal Medicine

## 2019-07-30 DIAGNOSIS — K769 Liver disease, unspecified: Secondary | ICD-10-CM

## 2019-08-21 NOTE — Progress Notes (Deleted)
64 y.o. G2P0010 Married White or Caucasian female here for annual exam.    Patient's last menstrual period was 04/17/2000.          Sexually active: {yes no:314532}  The current method of family planning is {contraception:315051}.    Exercising: {yes no:314532}  {types:19826} Smoker:  {YES [id number]  Health Maintenance: Pap:  2011 normal History of abnormal Pap:  no MMG:  05-06-2018 category b density birads 1:neg Colonoscopy:  2009 normal BMD:   2009 TDaP:  2011 Pneumonia vaccine(s):  2012 Shingrix:   2018 Hep C testing: neg per patient 2018  Screening Labs: ***   reports that she has never smoked. She has never used smokeless tobacco. She reports that she does not drink alcohol or use drugs.  Past Medical History:  Diagnosis Date  . Anxiety   . Asthma   . Diabetes mellitus, type II, insulin dependent ([place])    On Jardiance, metformin and NovoLog sliding scale plus Tresiba  . DVT (deep venous thrombosis) ([place]) 1980  . Dyslipidemia    Hyperlipidemia, with hypertriglyceridemia-on fenofibrate and Lovaza  . GERD (gastroesophageal reflux disease)   . History of broken collarbone   . Hypertension   . Hypothyroidism    On pretty high-dose of Synthroid  . Liver hemangioma    right lobe  . Nephrolithiasis   . Osteoarthritis   . Panic disorder   . Rosacea     Past Surgical History:  Procedure Laterality Date  . BREAST DUCTAL SYSTEM EXCISION Left 11/1997  . CHOLECYSTECTOMY  2005  . COMBINED HYSTEROSCOPY DIAGNOSTIC / D&C  1998  . excision of squamous cell Left 12/10/2016   Dr. Renda Rolls  . INCONTINENCE SURGERY  1999  . KIDNEY STONE SURGERY     Extraction  . KNEE ARTHROSCOPY Left 12/02  . KNEE ARTHROSCOPY Right 12/03  . OTHER SURGICAL HISTORY  11/13   Rotator Cuff Sugery  . REPLACEMENT TOTAL KNEE Left 5/11  . REPLACEMENT TOTAL KNEE Right 11/12  . ROTATOR CUFF REPAIR Right 02/2012  . THYROIDECTOMY, PARTIAL Right 1994  . THYROIDECTOMY, PARTIAL Left 1996  . TOTAL  ABDOMINAL HYSTERECTOMY  2002   ovaries remain    Current Outpatient Medications  Medication Sig Dispense Refill  . albuterol (PROVENTIL HFA;VENTOLIN HFA) 108 (90 BASE) MCG/ACT inhaler Inhale 2 puffs into the lungs every 6 (six) hours as needed. For shortness of breath     . B Complex Vitamins (B COMPLEX-B12) TABS Take by mouth.    . B-D ULTRAFINE III SHORT PEN 31G X 8 MM MISC     . Cholecalciferol (VITAMIN D PO) Take by mouth daily.    . Cyanocobalamin (VITAMIN B 12 PO) Take by mouth daily.    Marland Kitchen desonide (DESOWEN) 0.05 % ointment APPLY ON EYELIDS TWICE DAILY FOR 3 DAYS WHEN FLARED  1  . escitalopram (LEXAPRO) 20 MG tablet Take 20 mg by mouth daily.  5  . fenofibrate 160 MG tablet Take 160 mg by mouth at bedtime.      Marland Kitchen HALOG 0.1 % OINT APPLY TO AFFECTED AREA 1-2 TIMES DAILY FOR 10 DAYS  0  . ibuprofen (ADVIL,MOTRIN) 100 MG tablet Take 800 mg by mouth every 8 (eight) hours as needed. For pain     . JARDIANCE 25 MG TABS tablet TAKE 1 TABLET BY MOUTH EVERY DAY OK TO REPLACE INVOKANA AFTER 04-18-15 BASED ON FORMULARY COVERAGE  11  . levothyroxine (SYNTHROID, LEVOTHROID) 150 MCG tablet Take 150 mcg by mouth daily.      Marland Kitchen  losartan-hydrochlorothiazide (HYZAAR) 100-25 MG per tablet Take 1 tablet by mouth daily.      . metFORMIN (GLUCOPHAGE) 1000 MG tablet Take 1,000 mg by mouth 2 (two) times daily before lunch and supper.      . metoprolol tartrate (LOPRESSOR) 50 MG tablet Take 2 tablets (100 mg total) by mouth once for 1 dose. TAKE ONE HOUR PRIOR TO  SCHEDULE CARDIAC TEST 2 tablet 0  . NONFORMULARY OR COMPOUNDED ITEM Vit E vaginal 200u/ml, one pv three times weekly. 36 each 4  . NOVOLOG FLEXPEN 100 UNIT/ML FlexPen USE AS DIRECTED 2-3 TIMES DAILY WITH MEALS PER SLIDING SCALE  6  . nystatin cream (MYCOSTATIN) Apply 1 application topically 2 (two) times daily. 100,000U/gram use BID x 7days prn itching 30 g 2  . omega-3 acid ethyl esters (LOVAZA) 1 G capsule Take 2 g by mouth daily.      Marland Kitchen omeprazole  (PRILOSEC) 20 MG capsule Take 20 mg by mouth every morning.      . ONE TOUCH ULTRA TEST test strip     . rosuvastatin (CRESTOR) 5 MG tablet TAKE 1 ON WED AND SUNDAY    . TRESIBA FLEXTOUCH 200 UNIT/ML SOPN INJECT 40 UNITS IN THE MORNING AND 60 UNITS IN THE EVENING  9   No current facility-administered medications for this visit.    Family History  Problem Relation Age of Onset  . Prostate cancer Father   . Diabetes Mellitus II Father   . Hypertension Father   . Nephrolithiasis Father   . Alzheimer's disease Father   . Pancreatic cancer Mother   . Osteoarthritis Mother   . Uterine cancer Maternal Aunt   . Ulcers Sister   . Hypertension Sister   . Vascular Disease Paternal Uncle   . Dementia Paternal Uncle        Vascular (2 uncles  . CAD Paternal Aunt        CABG  . CVA Paternal Aunt     Review of Systems  Exam:   LMP 04/17/2000      General appearance: alert, cooperative and appears stated age Head: Normocephalic, without obvious abnormality, atraumatic Neck: no adenopathy, supple, symmetrical, trachea midline and thyroid {EXAM; THYROID:18604} Lungs: clear to auscultation bilaterally Breasts: {Exam; breast:13139::"normal appearance, no masses or tenderness"} Heart: regular rate and rhythm Abdomen: soft, non-tender; bowel sounds normal; no masses,  no organomegaly Extremities: extremities normal, atraumatic, no cyanosis or edema Skin: Skin color, texture, turgor normal. No rashes or lesions Lymph nodes: Cervical, supraclavicular, and axillary nodes normal. No abnormal inguinal nodes palpated Neurologic: Grossly normal   Pelvic: External genitalia:  no lesions              Urethra:  normal appearing urethra with no masses, tenderness or lesions              Bartholins and Skenes: normal                 Vagina: normal appearing vagina with normal color and discharge, no lesions              Cervix: {exam; cervix:14595}              Pap taken: {yes no:314532} Bimanual  Exam:  Uterus:  {exam; uterus:12215}              Adnexa: {exam; adnexa:12223}               Rectovaginal: Confirms  Anus:  normal sphincter tone, no lesions  Chaperone, ***Terence Lux, CMA, was present for exam.  A:  Well Woman with normal exam  P:   {plan; gyn:5269::"mammogram","pap smear","return annually or prn"}

## 2019-08-21 NOTE — Progress Notes (Deleted)
64 y.o. G2P0010 Married White or Caucasian female here for annual exam.    Patient's last menstrual period was 04/17/2000.          Sexually active: {yes no:314532}  The current method of family planning is {contraception:315051}.    Exercising: {yes no:314532}  {types:19826} Smoker:  {YES [id number]  Health Maintenance: Pap:  2011 normal History of abnormal Pap:  no MMG:  05-06-2018 category b density birads 1:neg Colonoscopy:  2009 normal BMD:   2009 TDaP:  2011 Pneumonia vaccine(s):  2012 Shingrix:   2018 Hep C testing: neg 2018 per patient Screening Labs: ***   reports that she has never smoked. She has never used smokeless tobacco. She reports that she does not drink alcohol or use drugs.  Past Medical History:  Diagnosis Date  . Anxiety   . Asthma   . Diabetes mellitus, type II, insulin dependent ([place])    On Jardiance, metformin and NovoLog sliding scale plus Tresiba  . DVT (deep venous thrombosis) ([place]) 1980  . Dyslipidemia    Hyperlipidemia, with hypertriglyceridemia-on fenofibrate and Lovaza  . GERD (gastroesophageal reflux disease)   . History of broken collarbone   . Hypertension   . Hypothyroidism    On pretty high-dose of Synthroid  . Liver hemangioma    right lobe  . Nephrolithiasis   . Osteoarthritis   . Panic disorder   . Rosacea     Past Surgical History:  Procedure Laterality Date  . BREAST DUCTAL SYSTEM EXCISION Left 11/1997  . CHOLECYSTECTOMY  2005  . COMBINED HYSTEROSCOPY DIAGNOSTIC / D&C  1998  . excision of squamous cell Left 12/10/2016   Dr. Renda Rolls  . INCONTINENCE SURGERY  1999  . KIDNEY STONE SURGERY     Extraction  . KNEE ARTHROSCOPY Left 12/02  . KNEE ARTHROSCOPY Right 12/03  . OTHER SURGICAL HISTORY  11/13   Rotator Cuff Sugery  . REPLACEMENT TOTAL KNEE Left 5/11  . REPLACEMENT TOTAL KNEE Right 11/12  . ROTATOR CUFF REPAIR Right 02/2012  . THYROIDECTOMY, PARTIAL Right 1994  . THYROIDECTOMY, PARTIAL Left 1996  . TOTAL  ABDOMINAL HYSTERECTOMY  2002   ovaries remain    Current Outpatient Medications  Medication Sig Dispense Refill  . albuterol (PROVENTIL HFA;VENTOLIN HFA) 108 (90 BASE) MCG/ACT inhaler Inhale 2 puffs into the lungs every 6 (six) hours as needed. For shortness of breath     . B Complex Vitamins (B COMPLEX-B12) TABS Take by mouth.    . B-D ULTRAFINE III SHORT PEN 31G X 8 MM MISC     . Cholecalciferol (VITAMIN D PO) Take by mouth daily.    . Cyanocobalamin (VITAMIN B 12 PO) Take by mouth daily.    Marland Kitchen desonide (DESOWEN) 0.05 % ointment APPLY ON EYELIDS TWICE DAILY FOR 3 DAYS WHEN FLARED  1  . escitalopram (LEXAPRO) 20 MG tablet Take 20 mg by mouth daily.  5  . fenofibrate 160 MG tablet Take 160 mg by mouth at bedtime.      Marland Kitchen HALOG 0.1 % OINT APPLY TO AFFECTED AREA 1-2 TIMES DAILY FOR 10 DAYS  0  . ibuprofen (ADVIL,MOTRIN) 100 MG tablet Take 800 mg by mouth every 8 (eight) hours as needed. For pain     . JARDIANCE 25 MG TABS tablet TAKE 1 TABLET BY MOUTH EVERY DAY OK TO REPLACE INVOKANA AFTER 04-18-15 BASED ON FORMULARY COVERAGE  11  . levothyroxine (SYNTHROID, LEVOTHROID) 150 MCG tablet Take 150 mcg by mouth daily.      Marland Kitchen  losartan-hydrochlorothiazide (HYZAAR) 100-25 MG per tablet Take 1 tablet by mouth daily.      . metFORMIN (GLUCOPHAGE) 1000 MG tablet Take 1,000 mg by mouth 2 (two) times daily before lunch and supper.      . metoprolol tartrate (LOPRESSOR) 50 MG tablet Take 2 tablets (100 mg total) by mouth once for 1 dose. TAKE ONE HOUR PRIOR TO  SCHEDULE CARDIAC TEST 2 tablet 0  . NONFORMULARY OR COMPOUNDED ITEM Vit E vaginal 200u/ml, one pv three times weekly. 36 each 4  . NOVOLOG FLEXPEN 100 UNIT/ML FlexPen USE AS DIRECTED 2-3 TIMES DAILY WITH MEALS PER SLIDING SCALE  6  . nystatin cream (MYCOSTATIN) Apply 1 application topically 2 (two) times daily. 100,000U/gram use BID x 7days prn itching 30 g 2  . omega-3 acid ethyl esters (LOVAZA) 1 G capsule Take 2 g by mouth daily.      Marland Kitchen omeprazole  (PRILOSEC) 20 MG capsule Take 20 mg by mouth every morning.      . ONE TOUCH ULTRA TEST test strip     . rosuvastatin (CRESTOR) 5 MG tablet TAKE 1 ON WED AND SUNDAY    . TRESIBA FLEXTOUCH 200 UNIT/ML SOPN INJECT 40 UNITS IN THE MORNING AND 60 UNITS IN THE EVENING  9   No current facility-administered medications for this visit.    Family History  Problem Relation Age of Onset  . Prostate cancer Father   . Diabetes Mellitus II Father   . Hypertension Father   . Nephrolithiasis Father   . Alzheimer's disease Father   . Pancreatic cancer Mother   . Osteoarthritis Mother   . Uterine cancer Maternal Aunt   . Ulcers Sister   . Hypertension Sister   . Vascular Disease Paternal Uncle   . Dementia Paternal Uncle        Vascular (2 uncles  . CAD Paternal Aunt        CABG  . CVA Paternal Aunt     Review of Systems  Exam:   LMP 04/17/2000      General appearance: alert, cooperative and appears stated age Head: Normocephalic, without obvious abnormality, atraumatic Neck: no adenopathy, supple, symmetrical, trachea midline and thyroid {EXAM; THYROID:18604} Lungs: clear to auscultation bilaterally Breasts: {Exam; breast:13139::"normal appearance, no masses or tenderness"} Heart: regular rate and rhythm Abdomen: soft, non-tender; bowel sounds normal; no masses,  no organomegaly Extremities: extremities normal, atraumatic, no cyanosis or edema Skin: Skin color, texture, turgor normal. No rashes or lesions Lymph nodes: Cervical, supraclavicular, and axillary nodes normal. No abnormal inguinal nodes palpated Neurologic: Grossly normal   Pelvic: External genitalia:  no lesions              Urethra:  normal appearing urethra with no masses, tenderness or lesions              Bartholins and Skenes: normal                 Vagina: normal appearing vagina with normal color and discharge, no lesions              Cervix: {exam; cervix:14595}              Pap taken: {yes no:314532} Bimanual  Exam:  Uterus:  {exam; uterus:12215}              Adnexa: {exam; adnexa:12223}               Rectovaginal: Confirms  Anus:  normal sphincter tone, no lesions  Chaperone, ***Terence Lux, CMA, was present for exam.  A:  Well Woman with normal exam  P:   {plan; gyn:5269::"mammogram","pap smear","return annually or prn"}

## 2019-08-22 ENCOUNTER — Ambulatory Visit: Payer: BC Managed Care – PPO | Admitting: Obstetrics & Gynecology

## 2019-08-28 ENCOUNTER — Ambulatory Visit
Admission: RE | Admit: 2019-08-28 | Discharge: 2019-08-28 | Disposition: A | Discharge status: Home / Self Care | Payer: BC Managed Care – PPO | Source: Ambulatory Visit | Attending: Internal Medicine | Admitting: Internal Medicine

## 2019-08-28 ENCOUNTER — Other Ambulatory Visit: Payer: Self-pay

## 2019-08-28 DIAGNOSIS — K769 Liver disease, unspecified: Secondary | ICD-10-CM

## 2019-08-28 IMAGING — MR MR ABDOMEN WO/W CM
11 of 17 series · 24 of 48 positions shown · IV contrast (multihance)
Comparison: MRI [DATE]

CLINICAL DATA: Liver lesion identified on prior MRI. Follow-up
recommended for probably benign lesion.

EXAM:
MRI ABDOMEN WITHOUT AND WITH CONTRAST
TECHNIQUE: Multiplanar multisequence MR imaging of the abdomen was performed
both before and after the administration of intravenous contrast.
CONTRAST:  20mL MULTIHANCE GADOBENATE DIMEGLUMINE 529 MG/ML IV SOLN

[Series 3: T2 · coronal · 5.0mm · 1.56mm/px · 1 of 33 slices shown (1 of 3)]
[im 1/33]
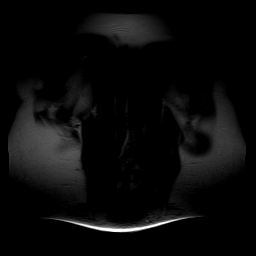

[Series 4: T2 · axial · 5.0mm · 1.52mm/px · 1 of 45 slices shown (2 of 3)]
[im 1/45]
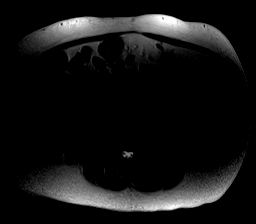

[Series 5: axial in out · axial · 5.5mm · 0.76mm/px · z∈[-97,+200]mm · 2 of 96 slices shown]
[im 1/96]
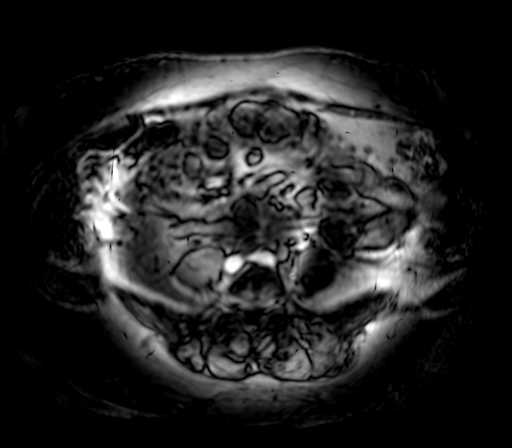
[im 96/96]
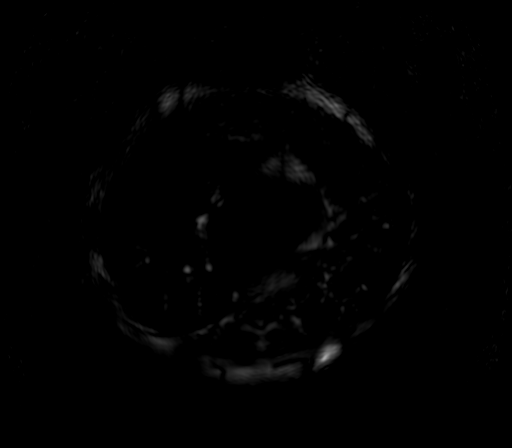

[Series 6: axial tru fisp · axial · 5.0mm · 1.52mm/px · 1 of 48 slices shown]
[im 1/48]
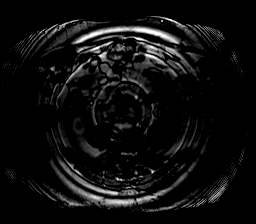

[Series 7: T2 · axial · 5.0mm · 0.76mm/px · 1 of 47 slices shown (3 of 3)]
[im 1/47]
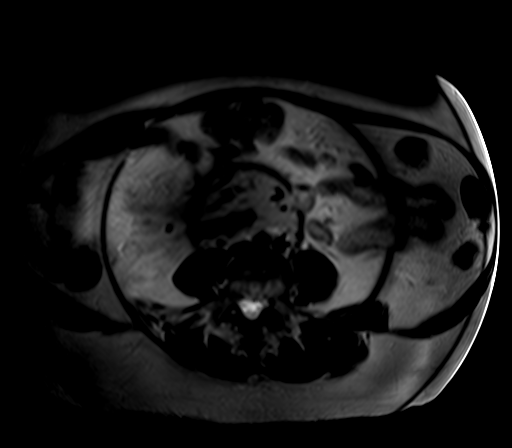

[Series 8: ep2d_diff_b50_500_800_p2_trig · axial · 5.0mm · 2.03mm/px · z∈[-84,+204]mm · 3 of 141 slices shown]
[im 1/141]
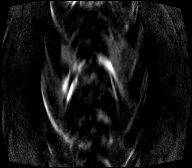
[im 71/141]
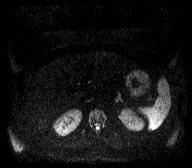
[im 141/141]
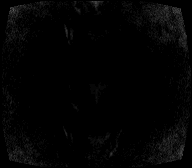

[Series 9: ep2d_[id number]_500_800_p2_trig_adc · axial · 5.0mm · 2.03mm/px · 1 of 47 slices shown]
[im 1/47]
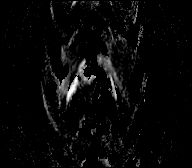

[Series 10: T1 dynamic · axial · non-contrast · 2.0mm · 0.78mm/px · z∈[-76,+178]mm · 3 of 128 slices shown]
[im 1/128]
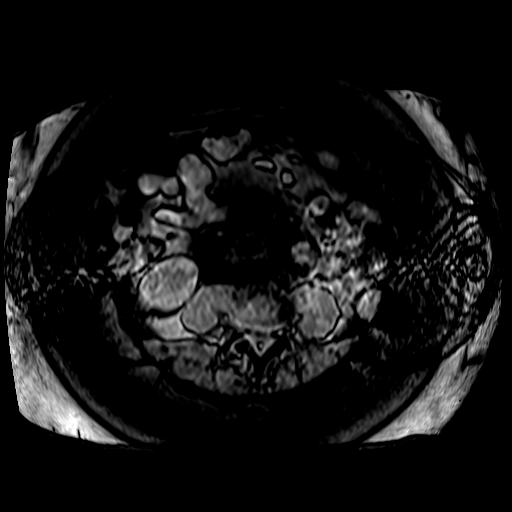
[im 64/128]
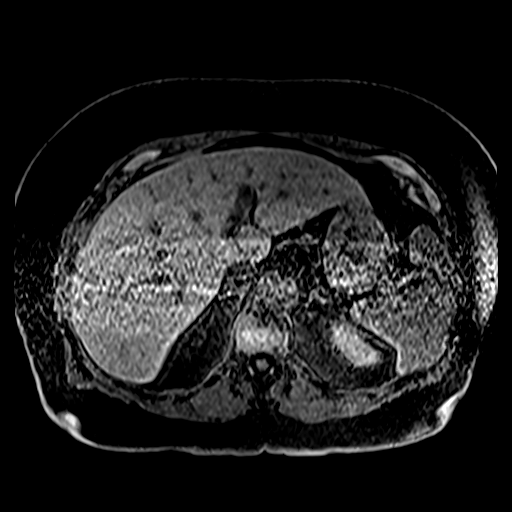
[im 128/128]
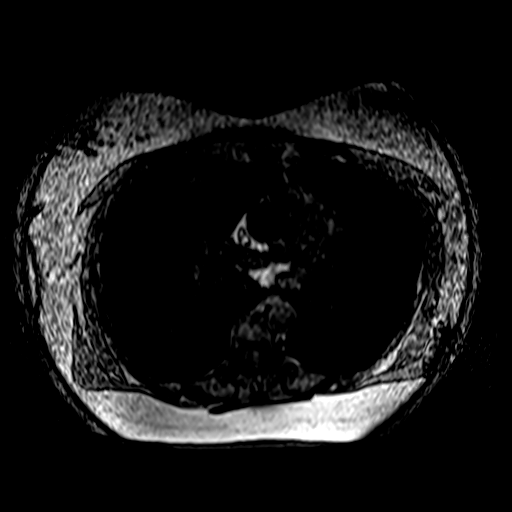

[Series 11: post 25 sec · axial · 2.0mm · 0.78mm/px · z∈[-76,+178]mm · 4 of 128 slices shown]
[im 1/128]
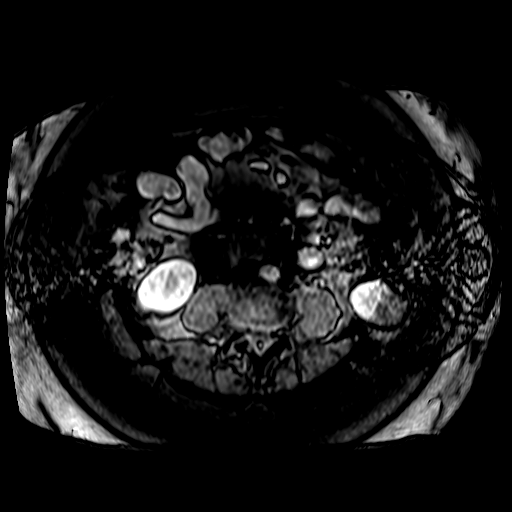
[im 43/128]
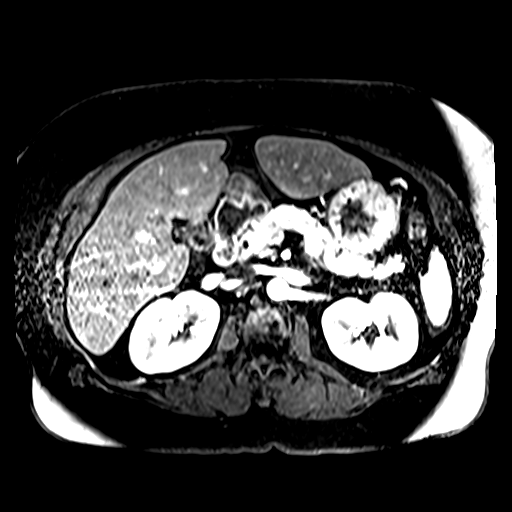
[im 85/128]
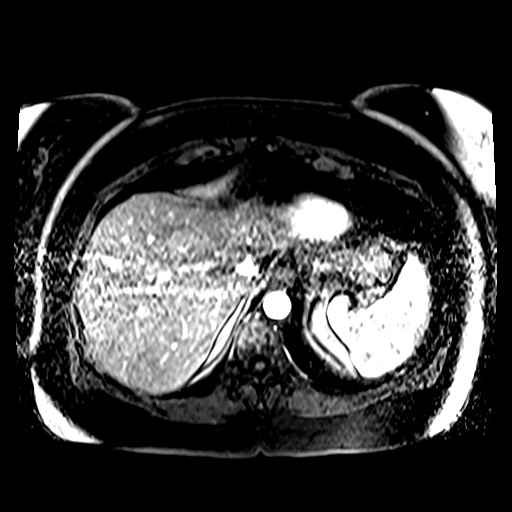
[im 128/128]
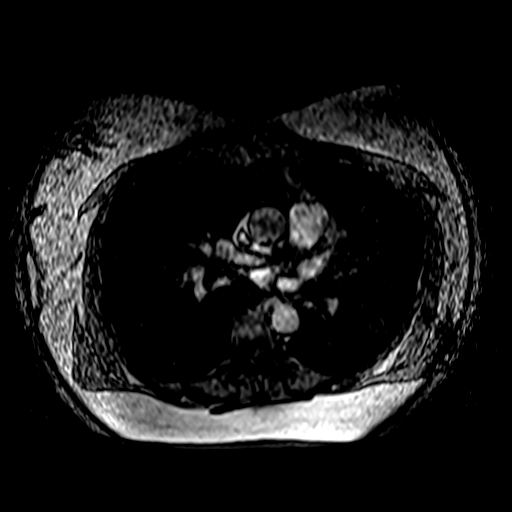

[Series 12: post 25 sec_sub · axial · 2.0mm · 0.78mm/px · z∈[-76,+178]mm · 4 of 127 slices shown]
[im 1/127]
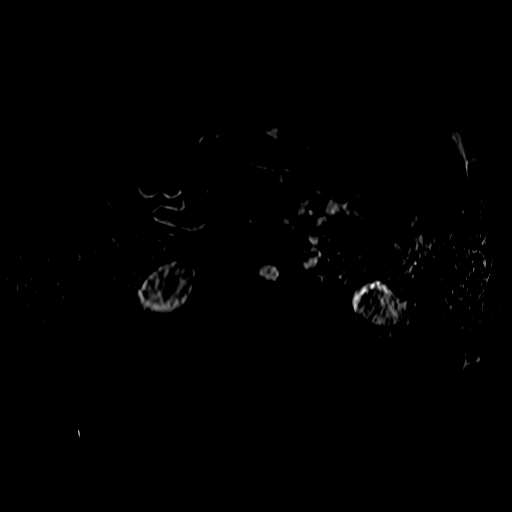
[im 43/127]
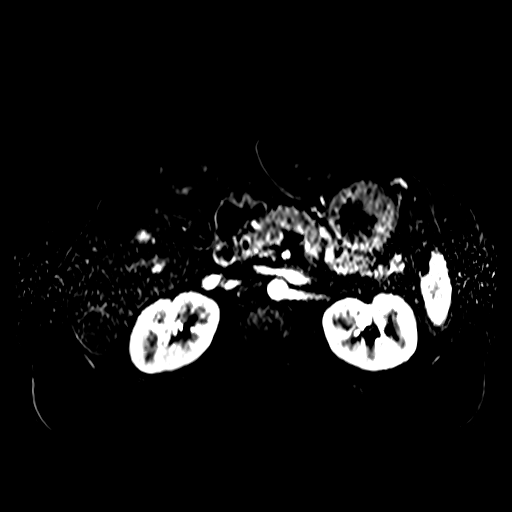
[im 85/127]
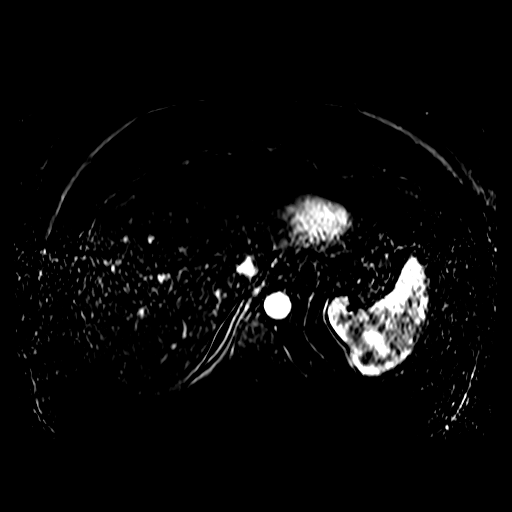
[im 127/127]
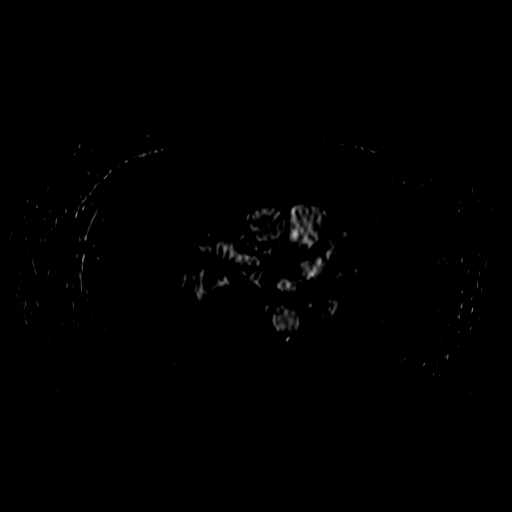

[Series 13: post 45 sec · axial · 2.0mm · 0.78mm/px · z∈[-76,+92]mm · 3 of 128 slices shown]
[im 1/128]
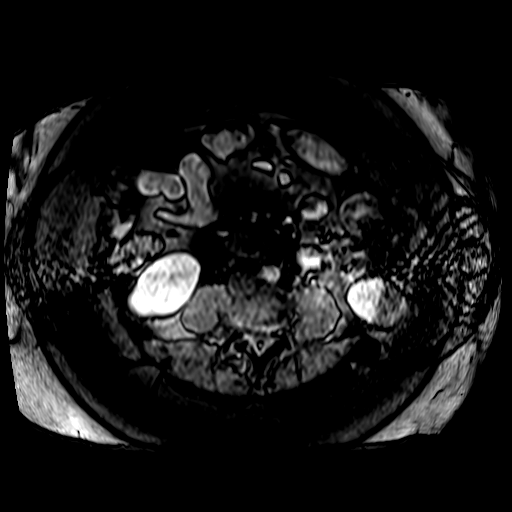
[im 43/128]
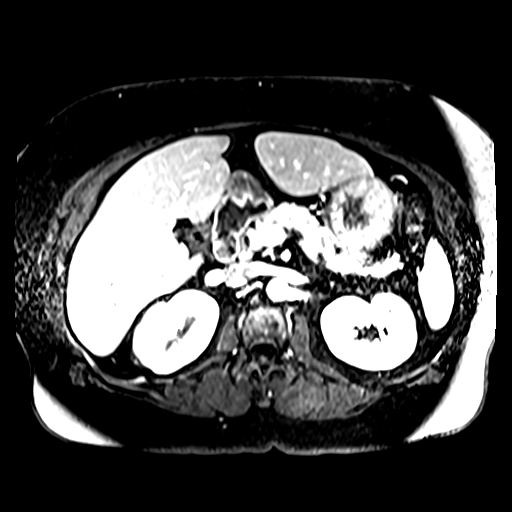
[im 85/128]
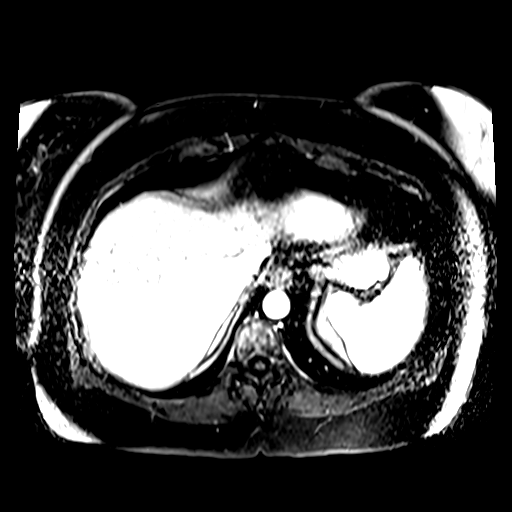

[24 of 48 positions shown; findings below may reference images not displayed]

FINDINGS: Lower chest:  Lung bases are clear.

Hepatobiliary: Early enhancing lesion within the RIGHT hepatic lobe
(segment 7) measures 13 mm on image 58/series 11 unchanged from 14
mm. Lesion becomes isointense to the vessels on more delayed phase
imaging (series 13) and isodense to liver parenchyma on the most
delayed imaging. The findings remain most consistent with a small
focal nodular hyperplasia.

Three additional smaller hypervascular lesions evident only on the
early arterial phase imaging are also unchanged: RIGHT hepatic lobe
image 42/11 and image 82/11 and LEFT hepatic lobe image 66/11. No
new lesions are present.

Pancreas: Normal pancreatic parenchymal intensity. No ductal
dilatation or inflammation. Tiny "ZHIZHONG" lesion at the tail of
the tail the pancreas measuring 2 mm (image [DATE]) unchanged.

No biliary duct dilatation.  Postcholecystectomy.

Spleen: Normal spleen.

Adrenals/urinary tract: Adrenal glands and kidneys are normal.

Stomach/Bowel: Stomach and limited of the small bowel is
unremarkable

Vascular/Lymphatic: Abdominal aortic normal caliber. No
retroperitoneal periportal lymphadenopathy.

Musculoskeletal: No aggressive osseous lesion
IMPRESSION: 1. Stable hypervascular lesions in the liver most consistent with
benign focal nodular hyperplasia.
2. Tiny "ZHIZHONG" lesion at the tail the pancreas is most
consistent with benign cyst. Consensus criteria recommends single
follow-up MRI in 2 years from current scan. This recommendation
follows ACR consensus guidelines: Management of Incidental
Pancreatic Cysts: A White Paper of the ACR Incidental Findings
Committee. [HOSPITAL] [E7];[DATE].

## 2019-08-28 MED ORDER — GADOBENATE DIMEGLUMINE 529 MG/ML IV SOLN
20.0000 mL | Freq: Once | INTRAVENOUS | Status: AC | PRN
  Administered 2019-08-28: 20 mL via INTRAVENOUS

## 2019-08-29 NOTE — Progress Notes (Signed)
64 y.o. G2P0010 Married White or Caucasian female here for annual exam. Patient complains of having yeast on the lower abdomen.    Had new calcifications on MMG.  Biopsy showed benign findings--fibroadenoma with calcifications.  Has been vaccinated for Covid.  Denies vaginal bleeding.      Has blood work scheduled in August.  Most recent HbA1C was 7.8.  Had some issues with skin yeast.  She treated with topical nystatin.    Patient's last menstrual period was 04/17/2000.          Sexually active: No.  The current method of family planning is status post hysterectomy.    Exercising: Yes.    walking Smoker:  no  Health Maintenance: Pap:  2011 normal History of abnormal Pap:  no MMG: done in April at [facility] Colonoscopy:  2009 normal BMD:   2009.  Has done this with Dr. Virgina Jock.   TDaP:  2011.  Pt aware my records show this is due Pneumonia vaccine(s):  2012 Shingrix:   2018 Hep C testing: neg 2018 per patient Screening Labs: PCP   reports that she has never smoked. She has never used smokeless tobacco. She reports that she does not drink alcohol or use drugs.  Past Medical History:  Diagnosis Date  . Anxiety   . Asthma   . Diabetes mellitus, type II, insulin dependent ([place])    On Jardiance, metformin and NovoLog sliding scale plus Tresiba  . DVT (deep venous thrombosis) ([place]) 1980  . Dyslipidemia    Hyperlipidemia, with hypertriglyceridemia-on fenofibrate and Lovaza  . GERD (gastroesophageal reflux disease)   . History of broken collarbone   . Hypertension   . Hypothyroidism    On pretty high-dose of Synthroid  . Liver hemangioma    right lobe  . Nephrolithiasis   . Osteoarthritis   . Panic disorder   . Rosacea     Past Surgical History:  Procedure Laterality Date  . BREAST DUCTAL SYSTEM EXCISION Left 11/1997  . CHOLECYSTECTOMY  2005  . COMBINED HYSTEROSCOPY DIAGNOSTIC / D&C  1998  . excision of squamous cell Left 12/10/2016   Dr. Renda Rolls  . INCONTINENCE  SURGERY  1999  . KIDNEY STONE SURGERY     Extraction  . KNEE ARTHROSCOPY Left 12/02  . KNEE ARTHROSCOPY Right 12/03  . OTHER SURGICAL HISTORY  11/13   Rotator Cuff Sugery  . REPLACEMENT TOTAL KNEE Left 5/11  . REPLACEMENT TOTAL KNEE Right 11/12  . ROTATOR CUFF REPAIR Right 02/2012  . THYROIDECTOMY, PARTIAL Right 1994  . THYROIDECTOMY, PARTIAL Left 1996  . TOTAL ABDOMINAL HYSTERECTOMY  2002   ovaries remain    Current Outpatient Medications  Medication Sig Dispense Refill  . albuterol (PROVENTIL HFA;VENTOLIN HFA) 108 (90 BASE) MCG/ACT inhaler Inhale 2 puffs into the lungs every 6 (six) hours as needed. For shortness of breath     . B Complex Vitamins (B COMPLEX-B12) TABS Take by mouth.    . B-D ULTRAFINE III SHORT PEN 31G X 8 MM MISC     . Cholecalciferol (VITAMIN D PO) Take by mouth daily.    Marland Kitchen escitalopram (LEXAPRO) 20 MG tablet Take 20 mg by mouth daily.  5  . fenofibrate 160 MG tablet Take 160 mg by mouth at bedtime.      Marland Kitchen ibuprofen (ADVIL,MOTRIN) 100 MG tablet Take 800 mg by mouth every 8 (eight) hours as needed. For pain     . JARDIANCE 25 MG TABS tablet TAKE 1 TABLET BY MOUTH EVERY  DAY OK TO REPLACE INVOKANA AFTER 04-18-15 BASED ON FORMULARY COVERAGE  11  . levothyroxine (SYNTHROID, LEVOTHROID) 150 MCG tablet Take 150 mcg by mouth daily.      Marland Kitchen losartan-hydrochlorothiazide (HYZAAR) 100-25 MG per tablet Take 1 tablet by mouth daily.      . metFORMIN (GLUCOPHAGE) 1000 MG tablet Take 1,000 mg by mouth 2 (two) times daily before lunch and supper.      . metoprolol tartrate (LOPRESSOR) 50 MG tablet Take 2 tablets (100 mg total) by mouth once for 1 dose. TAKE ONE HOUR PRIOR TO  SCHEDULE CARDIAC TEST 2 tablet 0  . NOVOLOG FLEXPEN 100 UNIT/ML FlexPen USE AS DIRECTED 2-3 TIMES DAILY WITH MEALS PER SLIDING SCALE  6  . nystatin-triamcinolone (MYCOLOG II) cream APPLY TO AFFECTED AREA TWICE A DAY    . omega-3 acid ethyl esters (LOVAZA) 1 G capsule Take 2 g by mouth daily.      Marland Kitchen omeprazole  (PRILOSEC) 20 MG capsule Take 20 mg by mouth every morning.      . ONE TOUCH ULTRA TEST test strip     . rosuvastatin (CRESTOR) 5 MG tablet TAKE 1 ON WED AND SUNDAY    . tacrolimus (PROTOPIC) 0.1 % ointment APPLY ONCE A DAY AS DIRECTED    . TRESIBA FLEXTOUCH 200 UNIT/ML SOPN INJECT 40 UNITS IN THE MORNING AND 60 UNITS IN THE EVENING  9  . HALOG 0.1 % OINT APPLY TO AFFECTED AREA 1-2 TIMES DAILY FOR 10 DAYS  0  . NONFORMULARY OR COMPOUNDED ITEM Vit E vaginal 200u/ml, one pv three times weekly. (Patient not taking: Reported on 09/01/2019) 36 each 4   No current facility-administered medications for this visit.    Family History  Problem Relation Age of Onset  . Prostate cancer Father   . Diabetes Mellitus II Father   . Hypertension Father   . Nephrolithiasis Father   . Alzheimer's disease Father   . Pancreatic cancer Mother   . Osteoarthritis Mother   . Uterine cancer Maternal Aunt   . Ulcers Sister   . Hypertension Sister   . Vascular Disease Paternal Uncle   . Dementia Paternal Uncle        Vascular (2 uncles  . CAD Paternal Aunt        CABG  . CVA Paternal Aunt     Review of Systems  All other systems reviewed and are negative.   Exam:   BP 118/70 (BP Location: Left Arm, Patient Position: Sitting, Cuff Size: Normal)   Pulse 84   Temp (!) 96.6 F (35.9 C) (Temporal)   Ht 5' 5.5" (1.664 m)   Wt 210 lb (95.3 kg)   LMP 04/17/2000   BMI 34.41 kg/m   Height: 5' 5.5" (166.4 cm)  General appearance: alert, cooperative and appears stated age Head: Normocephalic, without obvious abnormality, atraumatic Neck: no adenopathy, supple, symmetrical, trachea midline and thyroid normal to inspection and palpation Lungs: clear to auscultation bilaterally Breasts: normal appearance, no masses or tenderness Heart: regular rate and rhythm Abdomen: soft, non-tender; bowel sounds normal; no masses,  no organomegaly Extremities: extremities normal, atraumatic, no cyanosis or edema Skin:  Skin color, texture, turgor normal. No rashes or lesions Lymph nodes: Cervical, supraclavicular, and axillary nodes normal. No abnormal inguinal nodes palpated Neurologic: Grossly normal   Pelvic: External genitalia:  no lesions              Urethra:  normal appearing urethra with no masses, tenderness or lesions  Bartholins and Skenes: normal                 Vagina: normal appearing vagina with normal color and discharge, no lesions              Cervix: absent              Pap taken: No. Bimanual Exam:  Uterus:  uterus absent              Adnexa: no mass, fullness, tenderness               Rectovaginal: Confirms               Anus:  normal sphincter tone, no lesions  Chaperone, Terence Lux, CMA, was present for exam.  A:  Well Woman with normal exam PMP, no HRT H/o DVT in her 20's Diabetes mellitus  Elevated triglycerides Hypothyroidism form bilateral partial thyroidectomy  H/o fecal incontinence due to decreased rectal tone Skin candida   P:   Mammogram guidelines reviewed. pap smear not indicated Vit E cream 200u/ml, 1-2 ml pv two to three times weekly Nystatin cream rx and diflucan rx to pharmacy for when pt is experiencing more issues with skin yeast Lab work done with Dr. Virgina Jock Willing to consider Cologuard testing.  Order will be placed once pt checks insurance. Return annually or prn

## 2019-09-01 ENCOUNTER — Ambulatory Visit: Payer: BC Managed Care – PPO | Admitting: Obstetrics & Gynecology

## 2019-09-01 ENCOUNTER — Encounter: Payer: Self-pay | Admitting: Obstetrics & Gynecology

## 2019-09-01 ENCOUNTER — Other Ambulatory Visit: Payer: Self-pay

## 2019-09-01 VITALS — BP 118/70 | HR 84 | Temp 96.6°F | Ht 65.5 in | Wt 210.0 lb

## 2019-09-01 DIAGNOSIS — Z01419 Encounter for gynecological examination (general) (routine) without abnormal findings: Secondary | ICD-10-CM | POA: Diagnosis not present

## 2019-09-01 MED ORDER — FLUCONAZOLE 150 MG PO TABS: 150.0000 mg | ORAL_TABLET | Freq: Once | ORAL | 0 refills | Status: AC

## 2019-09-01 MED ORDER — NONFORMULARY OR COMPOUNDED ITEM: 3 refills | Status: DC

## 2019-09-01 MED ORDER — NYSTATIN 100000 UNIT/GM EX POWD: 1.0000 "application " | Freq: Two times a day (BID) | CUTANEOUS | 2 refills | Status: DC

## 2019-09-01 NOTE — Patient Instructions (Signed)
[email or web address]  Cologuard -- entire bowel movement without any prep used for colon cancer detection

## 2019-09-22 ENCOUNTER — Telehealth: Payer: Self-pay

## 2019-09-22 NOTE — Telephone Encounter (Signed)
Patient called in regards to yeast infection. Patient stated the symptoms still have not cleared up and wants to know if it is okay to take a 3rd dose of medication.

## 2019-09-22 NOTE — Telephone Encounter (Signed)
Spoke with pt. After reviewing with Dr Sabra Heck, pt advised ok to use 3rd Diflucan Rx tablet. Pt agreeable. States will take tomorrow.  Pt also advised to use OTC Lamisil or Lotrimin for skin yeast. Keep skin fold dry and use cornstarch based powder. Pt verbalized understanding. Pt to return call with any questions or concerns.  Routing to Dr Sabra Heck for review. Encounter closed.

## 2019-09-22 NOTE — Telephone Encounter (Signed)
AEX 09/01/19 Rx Nystatin  Rx Diflucan  PMP, no HRT H/O skin candida DM   Spoke with pt. Pt states still having large red patch of yeast on skin folds on stomach. Denies any other sx or concerns at this time.  Pt wanting to know if needs to take Diflucan Rx and/or Nystatin cream Rx. Pt states took 2nd pill of Diflucan Rx on Sat 09/20/19.  Pt states wasn't sure was suppose to have 31 tablets of Diflucan Rx and put some back at pharmacy. Pt states does have 1 more tablet to take if recommended.   Advised will review with Dr Sabra Heck and return call with recommendations. Pt agreeable.   Routing to Dr Sabra Heck

## 2019-09-23 ENCOUNTER — Encounter: Payer: Self-pay | Admitting: Obstetrics & Gynecology

## 2019-10-16 ENCOUNTER — Other Ambulatory Visit: Payer: Self-pay

## 2019-10-16 ENCOUNTER — Ambulatory Visit: Payer: BC Managed Care – PPO | Admitting: Podiatry

## 2019-10-16 ENCOUNTER — Encounter: Payer: Self-pay | Admitting: Podiatry

## 2019-10-16 VITALS — Temp 96.3°F

## 2019-10-16 DIAGNOSIS — M779 Enthesopathy, unspecified: Secondary | ICD-10-CM | POA: Diagnosis not present

## 2019-10-17 NOTE — Progress Notes (Signed)
Subjective:   Patient ID: [name], female   DOB: 64 y.o.   MRN: [id number]   HPI Patient presents stating I have had this painful inflammation fluid buildup underneath my fifth metatarsal my left foot and I have had this for a while and I had treatment several years ago   ROS      Objective:  Physical Exam  Neurovascular status intact with inflammatory fluid buildup capsulitis fifth MPJ left with pain     Assessment:  Inflammatory capsulitis fifth MPJ left with pain     Plan:  Sterile prep done injected the capsule 3 mg Dexasone Kenalog 5 mg Xylocaine debrided lesion advised on padding reappoint to recheck

## 2020-01-15 ENCOUNTER — Encounter: Payer: Self-pay | Admitting: Podiatry

## 2020-01-15 ENCOUNTER — Other Ambulatory Visit: Payer: Self-pay

## 2020-01-15 ENCOUNTER — Ambulatory Visit (INDEPENDENT_AMBULATORY_CARE_PROVIDER_SITE_OTHER): Payer: BC Managed Care – PPO | Admitting: Podiatry

## 2020-01-15 DIAGNOSIS — Q828 Other specified congenital malformations of skin: Secondary | ICD-10-CM | POA: Diagnosis not present

## 2020-01-15 DIAGNOSIS — M21622 Bunionette of left foot: Secondary | ICD-10-CM | POA: Diagnosis not present

## 2020-01-15 DIAGNOSIS — M779 Enthesopathy, unspecified: Secondary | ICD-10-CM

## 2020-01-15 NOTE — Progress Notes (Signed)
Subjective:   Patient ID: [name], female   DOB: 64 y.o.   MRN: [id number]   HPI Patient presents stating that this is still bothering me on my left foot   ROS      Objective:  Physical Exam  Neurovascular status intact with inflammation around the fifth metatarsal head left is painful with keratotic tissue formation     Assessment:  Fifth metatarsal irritation with inflammation fluid occurring around the joint surface     Plan:  Debrided tissue discussed tailor's bunion deformity and consideration for fifth metatarsal head resection.  We will get a hold off currently but that may be necessary at 1 point in future

## 2020-02-14 ENCOUNTER — Ambulatory Visit: Payer: BC Managed Care – PPO | Attending: Internal Medicine

## 2020-02-14 DIAGNOSIS — Z23 Encounter for immunization: Secondary | ICD-10-CM

## 2020-02-14 NOTE — Progress Notes (Signed)
° °  Covid-19 Vaccination Clinic  Name:  [name]    MRN: [id number] DOB: 1955/09/16  02/14/2020  [name] was observed post Covid-19 immunization for 15 minutes without incident. She was provided with Vaccine Information Sheet and instruction to access the V-Safe system.   Ms. Esters was instructed to call 911 with any severe reactions post vaccine:  Difficulty breathing   Swelling of face and throat   A fast heartbeat   A bad rash all over body   Dizziness and weakness

## 2020-03-03 ENCOUNTER — Telehealth: Payer: Self-pay

## 2020-03-03 NOTE — Telephone Encounter (Signed)
Pt called today and stated that she has been seen twice. Once in July and once in September for a blocked sweat gland located on her plantar foot. Pt states that her foot is now worst. Pt would like to know what options will be available before she schedules her third appointment. Please advise

## 2020-03-04 NOTE — Telephone Encounter (Signed)
We have to evaluate. We may be able to excise but generally does not have a great result

## 2020-03-04 NOTE — Telephone Encounter (Signed)
I will update the patient. Thank you

## 2020-03-24 ENCOUNTER — Ambulatory Visit: Payer: BC Managed Care – PPO | Admitting: Podiatry

## 2020-03-26 ENCOUNTER — Ambulatory Visit: Payer: BC Managed Care – PPO | Admitting: Podiatry

## 2020-03-26 ENCOUNTER — Encounter: Payer: Self-pay | Admitting: Podiatry

## 2020-03-26 ENCOUNTER — Other Ambulatory Visit: Payer: Self-pay

## 2020-03-26 DIAGNOSIS — Q828 Other specified congenital malformations of skin: Secondary | ICD-10-CM

## 2020-03-26 DIAGNOSIS — M21622 Bunionette of left foot: Secondary | ICD-10-CM

## 2020-03-27 NOTE — Progress Notes (Signed)
Subjective:   Patient ID: [name], female   DOB: 64 y.o.   MRN: [id number]   HPI Patient presents stating that the pain in her left foot is just as bad as it was at first visit and is not responding   ROS      Objective:  Physical Exam  Neurovascular status intact with exquisite discomfort fifth metatarsal head left with plantar lesion also associated with it that is sore     Assessment:  Chronic fifth metatarsal head inflammation pain along with keratotic plantar lesion     Plan:  H&P reviewed condition recommended fifth metatarsal head resection along with resection of plantar skin wedge for the lesion formation that is probably benign neoplasm.  I explained procedures risk allowed her to sign consent form going over complications alternative treatments and she wants surgery.  Patient scheduled outpatient surgery is encouraged to call with questions prior to procedure and understands recovery can take upwards of 6 months.  Air fracture walker dispensed today due to the plantar incision with instructions on usage

## 2020-03-30 ENCOUNTER — Telehealth: Payer: Self-pay | Admitting: Podiatry

## 2020-03-30 NOTE — Telephone Encounter (Signed)
DOS: 04/20/2020  Procedures: Metatarsal Head Resection 5th Lt [phone]) and Exc. Benign Lesion Over 4.0 cm Lt (11426)

## 2020-04-06 ENCOUNTER — Telehealth: Payer: Self-pay

## 2020-04-06 NOTE — Telephone Encounter (Signed)
DOS 04/20/2020  METATARSAL HEAD RES 5TH 5TH LT - 32671 EXC BENIGN LESION LT - 11426   [employer] ST EFFECTIVE DATE - 04/18/2019  PLAN DEDUCTIBLE - $1500.00 W/ $210.49 REMAINING OUT OF POCKET - $5900.00 W/ $2321.30 REMAINING COPAY $0.00 COINSURANCE - 30% PER SERVICE YEAR  NO AUTH REQUIRED PER WEBSITE

## 2020-04-17 HISTORY — PX: EXCISION / CURETTAGE BONE CYST TARSAL / METATARSAL: SUR481

## 2020-04-19 MED ORDER — HYDROCODONE-ACETAMINOPHEN 10-325 MG PO TABS: 1.0000 | ORAL_TABLET | Freq: Three times a day (TID) | ORAL | 0 refills | Status: AC | PRN

## 2020-04-19 NOTE — Addendum Note (Signed)
Addended by: Lenn Sink on: 04/19/2020 05:41 PM   Modules accepted: Orders

## 2020-04-20 ENCOUNTER — Encounter: Payer: Self-pay | Admitting: Podiatry

## 2020-04-20 DIAGNOSIS — M2012 Hallux valgus (acquired), left foot: Secondary | ICD-10-CM

## 2020-04-20 DIAGNOSIS — Q828 Other specified congenital malformations of skin: Secondary | ICD-10-CM

## 2020-04-22 ENCOUNTER — Telehealth: Payer: Self-pay | Admitting: Podiatry

## 2020-04-22 NOTE — Telephone Encounter (Signed)
The patient left a voicemail saying her face is flushing, almost like its sunburnt. She wanted to know why she is having that reaction and wanted to know what to do.

## 2020-04-23 NOTE — Telephone Encounter (Signed)
Not sure. She can take benadryl and stop any new meds

## 2020-04-26 ENCOUNTER — Other Ambulatory Visit: Payer: Self-pay

## 2020-04-26 ENCOUNTER — Encounter: Payer: Self-pay | Admitting: Podiatry

## 2020-04-26 ENCOUNTER — Ambulatory Visit (INDEPENDENT_AMBULATORY_CARE_PROVIDER_SITE_OTHER): Payer: BC Managed Care – PPO

## 2020-04-26 ENCOUNTER — Ambulatory Visit (INDEPENDENT_AMBULATORY_CARE_PROVIDER_SITE_OTHER): Payer: BC Managed Care – PPO | Admitting: Podiatry

## 2020-04-26 DIAGNOSIS — M21622 Bunionette of left foot: Secondary | ICD-10-CM

## 2020-04-27 NOTE — Progress Notes (Signed)
Subjective:   Patient ID: [name], female   DOB: 65 y.o.   MRN: [id number]   HPI Patient presents stating I am doing very well and very happy with surgery   ROS      Objective:  Physical Exam  Neurovascular status intact with patient's left foot healing well wound edges well coapted stitches in place doing well     Assessment:  Post surgical intervention left foot     Plan:  X-rays reviewed H&P done and reapplied sterile dressing and instructed on continued boot usage continued immobilization gradual usage of surgical shoe and encouraged to call with questions concerns and reappoint 2 weeks suture removal  X-rays indicate satisfactory resection of bone good alignment noted

## 2020-05-13 ENCOUNTER — Other Ambulatory Visit: Payer: Self-pay

## 2020-05-13 ENCOUNTER — Ambulatory Visit (INDEPENDENT_AMBULATORY_CARE_PROVIDER_SITE_OTHER): Payer: BC Managed Care – PPO

## 2020-05-13 ENCOUNTER — Encounter: Payer: Self-pay | Admitting: Podiatry

## 2020-05-13 ENCOUNTER — Ambulatory Visit (INDEPENDENT_AMBULATORY_CARE_PROVIDER_SITE_OTHER): Payer: BC Managed Care – PPO | Admitting: Podiatry

## 2020-05-13 DIAGNOSIS — M21622 Bunionette of left foot: Secondary | ICD-10-CM

## 2020-05-14 NOTE — Progress Notes (Signed)
Subjective:   Patient ID: [name], female   DOB: 65 y.o.   MRN: [id number]   HPI Patient states that she is doing very well and very pleased with the results of her surgery.  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  Patient will be seen back approximate 4 to 6 weeks and will gradually return to soft shoe gear in the next 2 weeks  X-rays indicate satisfactory resection head of fifth metatarsal left with no indication of pathology currently

## 2020-05-17 ENCOUNTER — Encounter: Payer: BC Managed Care – PPO | Admitting: Podiatry

## 2020-06-29 ENCOUNTER — Telehealth: Payer: Self-pay | Admitting: Podiatry

## 2020-06-29 NOTE — Telephone Encounter (Signed)
Pt called stating she had surgery on 04/21/19, and the bottom of her foot hurts where the stitches were. She states she feels a sharp pain whenever she steps. She would like to know if this is normal, or if there's something she needs to do. Please advise.

## 2020-06-30 NOTE — Telephone Encounter (Signed)
Hard to say. She may want to come in for check

## 2020-07-08 NOTE — Telephone Encounter (Signed)
Patient calling back stating her foot is still hurting. Would like to come in to be seen.

## 2020-07-09 ENCOUNTER — Ambulatory Visit: Payer: BC Managed Care – PPO | Admitting: Podiatry

## 2020-07-09 ENCOUNTER — Ambulatory Visit: Payer: BC Managed Care – PPO

## 2020-07-09 ENCOUNTER — Other Ambulatory Visit: Payer: Self-pay

## 2020-07-09 ENCOUNTER — Encounter: Payer: Self-pay | Admitting: Podiatry

## 2020-07-09 DIAGNOSIS — M779 Enthesopathy, unspecified: Secondary | ICD-10-CM

## 2020-07-09 DIAGNOSIS — M21622 Bunionette of left foot: Secondary | ICD-10-CM

## 2020-07-09 MED ORDER — TRIAMCINOLONE ACETONIDE 10 MG/ML IJ SUSP
10.0000 mg | Freq: Once | INTRAMUSCULAR | Status: AC
  Administered 2020-07-09: 10 mg

## 2020-07-09 NOTE — Telephone Encounter (Signed)
Done

## 2020-07-09 NOTE — Telephone Encounter (Signed)
Yes, can come in today at 11:15 if she is available

## 2020-07-12 ENCOUNTER — Ambulatory Visit: Payer: BC Managed Care – PPO | Attending: Otolaryngology

## 2020-07-12 ENCOUNTER — Other Ambulatory Visit: Payer: Self-pay

## 2020-07-12 DIAGNOSIS — R2681 Unsteadiness on feet: Secondary | ICD-10-CM | POA: Insufficient documentation

## 2020-07-12 DIAGNOSIS — R42 Dizziness and giddiness: Secondary | ICD-10-CM | POA: Diagnosis not present

## 2020-07-12 DIAGNOSIS — H8111 Benign paroxysmal vertigo, right ear: Secondary | ICD-10-CM | POA: Diagnosis present

## 2020-07-12 NOTE — Progress Notes (Signed)
Subjective:   Patient ID: [name], female   DOB: 65 y.o.   MRN: [id number]   HPI Patient states she was doing really well but the last few weeks she started developed some swelling and discomfort around the surgery but not in the same spot.  Patient states its not as sores which she used to experience but she is concerned   ROS      Objective:  Physical Exam  Neurovascular status intact with discomfort which is more proximal with some plantar swelling around the capsule not where we did the original surgery     Assessment:  Possibility for capsulitis of the submetatarsal area left with history of fifth metatarsal head resection with lesion resection that has healed well     Plan:  H&P x-ray reviewed and today I did sterile prep and injected more proximally with 2 mg Dexasone Kenalog 5 mg Xylocaine applied a pad to reduce pressure on the area and if symptoms persist she will be seen back  X-rays indicate that there is been satisfactory resection of the fifth metatarsal head left foot

## 2020-07-12 NOTE — Therapy (Signed)
[facility] [address] [place], [place], [address] Phone: [phone]   Fax:  [phone]  Physical Therapy Evaluation  Patient Details  Name: [name] MRN: [id number] Date of Birth: Jul 27, 1955 Referring Provider (PT): Melida Quitter, MD   Encounter Date: 07/12/2020   PT End of Session - 07/12/20 1007    Visit Number 1    Number of Visits 5    Date for PT Re-Evaluation 09/10/20   POC for 4 weeks, Cert for 60 days   Authorization Type BCBS    PT Start Time 0930    PT Stop Time 1008    PT Time Calculation (min) 38 min    Activity Tolerance Patient tolerated treatment well    Behavior During Therapy [facility] for tasks assessed/performed           Past Medical History:  Diagnosis Date  . Anxiety   . Asthma   . Diabetes mellitus, type II, insulin dependent ([place])    On Jardiance, metformin and NovoLog sliding scale plus Tresiba  . DVT (deep venous thrombosis) ([place]) 1980  . Dyslipidemia    Hyperlipidemia, with hypertriglyceridemia-on fenofibrate and Lovaza  . GERD (gastroesophageal reflux disease)   . History of broken collarbone   . Hypertension   . Hypothyroidism    On pretty high-dose of Synthroid  . Liver hemangioma    right lobe  . Nephrolithiasis   . Osteoarthritis   . Panic disorder   . Rosacea     Past Surgical History:  Procedure Laterality Date  . BREAST DUCTAL SYSTEM EXCISION Left 11/1997  . CHOLECYSTECTOMY  2005  . COMBINED HYSTEROSCOPY DIAGNOSTIC / D&C  1998  . excision of squamous cell Left 12/10/2016   Dr. Renda Rolls  . INCONTINENCE SURGERY  1999  . KIDNEY STONE SURGERY     Extraction  . KNEE ARTHROSCOPY Left 12/02  . KNEE ARTHROSCOPY Right 12/03  . OTHER SURGICAL HISTORY  11/13   Rotator Cuff Sugery  . REPLACEMENT TOTAL KNEE Left 5/11  . REPLACEMENT TOTAL KNEE Right 11/12  . ROTATOR CUFF REPAIR Right 02/2012  . THYROIDECTOMY, PARTIAL Right 1994  . THYROIDECTOMY, PARTIAL Left  1996  . TOTAL ABDOMINAL HYSTERECTOMY  2002   ovaries remain    There were no vitals filed for this visit.    Subjective Assessment - 07/12/20 0935    Subjective Patient reports that approx two years ago she had vertigo episodes but it resolved. Recently had another episode that started approx 4 weeks ago. Patient reports that she feels that her balance has been off as well, especially in the dark. Reports spinning sensation that lasts brief (approx 10-15) seconds. Reports she notices it the most when she bends over and rolling in bed. No falls to report.    Pertinent History Anxiety, OA, Hypothyroidism, HTN, GERD, Hx of DVT, Asthma, Anxiety,    Limitations House hold activities;Walking    Patient Stated Goals Resolve the Vertigo    Currently in Pain? No/denies              [facility] PT Assessment - 07/12/20 0001      Assessment   Medical Diagnosis Vertigo    Referring Provider (PT) Melida Quitter, MD    Onset Date/Surgical Date 07/07/20    Prior Therapy None      Precautions   Precautions Fall      Balance Screen   Has the patient fallen in the past 6 months No    Has  the patient had a decrease in activity level because of a fear of falling?  Yes    Is the patient reluctant to leave their home because of a fear of falling?  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; gardening (currently afraid to get out in the garden)      Cognition   Overall Cognitive Status Within Functional Limits for tasks assessed       Observation/Other Assessments   Focus on Therapeutic Outcomes (FOTO)  DPS:49 ; DFS:48.8      Sensation   Light Touch Appears Intact      ROM / Strength   AROM / PROM / Strength Strength      Strength   Overall Strength Within functional limits for tasks performed      Transfers   Transfers Sit to Stand;Stand to Sit    Sit to Stand 7: Independent    Stand to Sit 7: Independent      Ambulation/Gait   Ambulation/Gait Yes    Ambulation/Gait Assistance 7: Independent    Assistive device None    Gait Pattern Within Functional Limits    Ambulation Surface Level;Indoor              Vestibular Assessment - 07/12/20 0001      Symptom Behavior   Subjective history of current problem See subjective. Patient denies tinnitus/double vision. Patient reports she is having spinning, but is not having nauseous. Did have nauseous sensation approx 2 years ago    Type of Dizziness  Imbalance;Spinning;Vertigo    Frequency of Dizziness daily    Duration of Dizziness 10-15 seconds    Symptom Nature Motion provoked    Aggravating Factors Looking up to the ceiling;Rolling to right;Rolling to left;Forward bending;Lying supine;Supine to sit    Relieving Factors Slow movements;Rest    Progression of Symptoms No change since onset    History of similar episodes prior episode in 2020      Oculomotor Exam   Oculomotor Alignment Normal    Ocular ROM WNL    Spontaneous Absent    Gaze-induced  Absent    Smooth Pursuits Intact    Saccades Intact      Oculomotor Exam-Fixation Suppressed    Left Head Impulse Normal    Right Head Impulse Normal      Vestibulo-Ocular Reflex   VOR 1 Head Only (x 1 viewing) No Dizziness. Normal      Positional Testing   Dix-Hallpike Dix-Hallpike Right;Dix-Hallpike Left      Dix-Hallpike Right   Dix-Hallpike Right Duration 15 seconds    Dix-Hallpike Right Symptoms Upbeat, right rotatory nystagmus      Dix-Hallpike Left   Dix-Hallpike Left Duration 0     Dix-Hallpike Left Symptoms No nystagmus              Objective measurements completed on examination: See above findings.  Vestibular Treatment/Exercise - 07/12/20 0001      Vestibular Treatment/Exercise   Vestibular Treatment Provided Canalith Repositioning    Canalith Repositioning Epley Manuever Right       EPLEY MANUEVER RIGHT   Number of Reps  2    Overall Response Symptoms Resolved    Response Details  no nystagmus/symptoms reported after second rep                 PT Education - 07/12/20 0932    Education Details Educated on [employer]; Results of Positional Testing (R BPPV)    Person(s) Educated Patient    Methods Explanation    Comprehension Verbalized understanding            PT Short Term Goals - 07/12/20 1008      PT SHORT TERM GOAL #1   Title = LTGs             PT Long Term Goals - 07/12/20 1008      PT LONG TERM GOAL #1   Title Patient will be independent with balance HEP (All LTGs Due: 08/09/20)    Baseline TBA    Time 4    Period Weeks    Status New    Target Date 08/09/20      PT LONG TERM GOAL #2   Title Patient will demonstrate negative positional testing and no dizziness with bed mobility to demo resolution of BPPV    Baseline R BPPV    Time 4    Period Weeks    Status New      PT LONG TERM GOAL #3   Title Patient will improve DPS to >/= 55    Baseline 49    Time 4    Period Weeks    Status New      PT LONG TERM GOAL #4   Title Patient will improve FGA by 4 points from baseline    Baseline TBA    Time 4    Period Weeks    Status New                  Plan - 07/12/20 1012    Clinical Impression Statement Patient is a 66 y.o. female that was referred to Neuro OPPT services for Vertigo. Patient's PMH significant for the following: Anxiety, OA, Hypothyroidism, HTN, GERD, Hx of DVT, Asthma. Patient presented with the following impiarments: dizziness, imbalance (to be further assessed), and  R upbeating nystagmus of short duration indicating R Canal Canalithiasis. Completed treatment x 2 reps with resolution of symptoms noted. Patient will benefit from skilled PT services to address impairments and improve activity tolerance with reduced symptoms.    Personal Factors and Comorbidities Comorbidity 3+    Comorbidities Anxiety, OA, Hypothyroidism, HTN, GERD, Hx of DVT, Asthma,    Examination-Activity Limitations Bed Mobility;Bend;Sit;Caring for Others;Locomotion Level;Transfers;Reach Overhead    Examination-Participation Restrictions Occupation;Community Activity;Yard Work;Interpersonal Relationship    Stability/Clinical Decision Making Evolving/Moderate complexity    Clinical Decision Making Moderate    Rehab Potential Good    PT Frequency 1x / week    PT Duration 4 weeks    PT Treatment/Interventions ADLs/Self Care Home Management;Canalith Repostioning;Cryotherapy;Moist Heat;Gait training;Stair training;Functional mobility training;Therapeutic activities;Therapeutic exercise;Balance training;Manual techniques;Patient/family education;Neuromuscular re-education;Dry needling;Vestibular    PT Next Visit Plan Reassess BPPV (R). Assess Horizontal Canal. FGA when able.    Consulted and Agree with Plan of Care Patient           Patient will benefit from skilled  therapeutic intervention in order to improve the following deficits and impairments:  Dizziness,Decreased activity tolerance,Decreased balance  Visit Diagnosis: Dizziness and giddiness  BPPV (benign paroxysmal positional vertigo), right  Unsteadiness on feet     Problem List Patient Active Problem List   Diagnosis Date Noted  . Chest pain with moderate risk for cardiac etiology 01/15/2019  . Abnormal finding on EKG 01/15/2019  . Fatty liver disease, nonalcoholic 77/06/4033  . Hypertension 02/02/2017  . History of DVT (deep vein thrombosis) 02/02/2017  . Asthma   . Hypertriglyceridemia   . Panic disorder   . Type 2  diabetes mellitus without complication, with long-term current use of insulin ([place]) 09/15/2014  . Hypothyroidism 09/15/2014    Jones Bales, PT, DPT 07/12/2020, 12:19 PM  [facility] [address] [place], [place], [address] Phone: [phone]   Fax:  [phone]  Name: [name] MRN: [id number] Date of Birth: 29-Jun-1955

## 2020-07-21 ENCOUNTER — Ambulatory Visit: Payer: BC Managed Care – PPO

## 2020-07-21 ENCOUNTER — Ambulatory Visit: Payer: BC Managed Care – PPO | Admitting: Podiatry

## 2020-07-28 ENCOUNTER — Ambulatory Visit: Payer: BC Managed Care – PPO

## 2020-07-29 NOTE — Therapy (Signed)
[facility] [address] [place], [place], [address] Phone: [phone]   Fax:  [phone]  Patient Details  Name: [name] MRN: [id number] Date of Birth: [birth year] Referring Provider:  No ref. provider found  Encounter Date: 07/29/2020  PHYSICAL THERAPY NON VISIT DISCHARGE SUMMARY  Visits from Start of Care: 1   Current functional level related to goals / functional outcomes: Patient is being d/c from PT services due to reports of resolution of symptoms and no longer needing therapy services.    Remaining deficits: None   Education / Equipment: None  Plan: Patient agrees to discharge.  Patient goals were not met. Patient is being discharged due to the patient's request.  Patient did not return to PT services due to self-discharge, due to resolution of symptoms. ?????          Jones Bales, PT, DPT 07/29/2020, 1:29 PM  [facility] [address] [place], [place], [address] Phone: [phone]   Fax:  [phone]

## 2020-08-02 ENCOUNTER — Ambulatory Visit: Payer: BC Managed Care – PPO

## 2020-08-09 ENCOUNTER — Ambulatory Visit: Payer: BC Managed Care – PPO

## 2020-09-02 ENCOUNTER — Ambulatory Visit (HOSPITAL_BASED_OUTPATIENT_CLINIC_OR_DEPARTMENT_OTHER): Payer: BC Managed Care – PPO | Admitting: Obstetrics & Gynecology

## 2020-10-21 ENCOUNTER — Other Ambulatory Visit: Payer: Self-pay | Admitting: Internal Medicine

## 2020-10-21 DIAGNOSIS — K862 Cyst of pancreas: Secondary | ICD-10-CM

## 2020-10-21 DIAGNOSIS — K7689 Other specified diseases of liver: Secondary | ICD-10-CM

## 2020-10-21 DIAGNOSIS — D1803 Hemangioma of intra-abdominal structures: Secondary | ICD-10-CM

## 2020-10-30 ENCOUNTER — Other Ambulatory Visit: Payer: Self-pay

## 2020-10-30 ENCOUNTER — Ambulatory Visit
Admission: RE | Admit: 2020-10-30 | Discharge: 2020-10-30 | Disposition: A | Discharge status: Home / Self Care | Payer: BC Managed Care – PPO | Source: Ambulatory Visit | Attending: Internal Medicine | Admitting: Internal Medicine

## 2020-10-30 DIAGNOSIS — D1803 Hemangioma of intra-abdominal structures: Secondary | ICD-10-CM

## 2020-10-30 DIAGNOSIS — K7689 Other specified diseases of liver: Secondary | ICD-10-CM

## 2020-10-30 DIAGNOSIS — K862 Cyst of pancreas: Secondary | ICD-10-CM

## 2020-10-30 IMAGING — MR MR ABDOMEN WO/W CM
12 of 18 series · 31 of 48 positions shown · IV contrast (multihance)
Comparison: MRI [DATE] [DATE]

CLINICAL DATA: Follow-up hepatic and pancreatic lesion seen on
prior MRI.

EXAM:
MRI ABDOMEN WITHOUT AND WITH CONTRAST
TECHNIQUE: Multiplanar multisequence MR imaging of the abdomen was performed
both before and after the administration of intravenous contrast.
CONTRAST:  19mL MULTIHANCE GADOBENATE DIMEGLUMINE 529 MG/ML IV SOLN

[Series 3: T2 · coronal · 5.0mm · 1.56mm/px · 2 of 35 slices shown (1 of 3)]
[im 1/35]
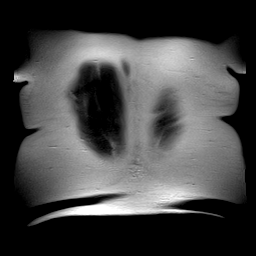
[im 35/35]
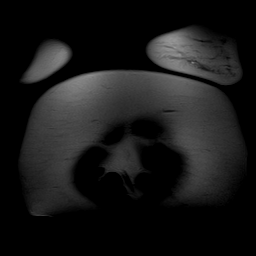

[Series 4: T2 · axial · 6.0mm · 1.56mm/px · z∈[-147,+94]mm · 2 of 36 slices shown (2 of 3)]
[im 1/36]
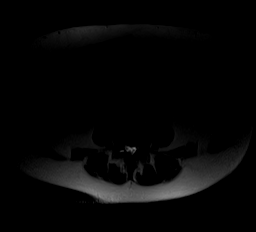
[im 36/36]
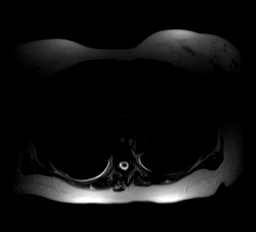

[Series 7: T2 · axial · 6.0mm · 0.78mm/px · z∈[-174,+102]mm · 2 of 41 slices shown (3 of 3)]
[im 1/41]
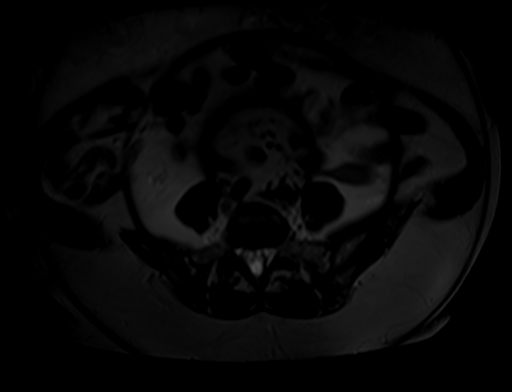
[im 41/41]
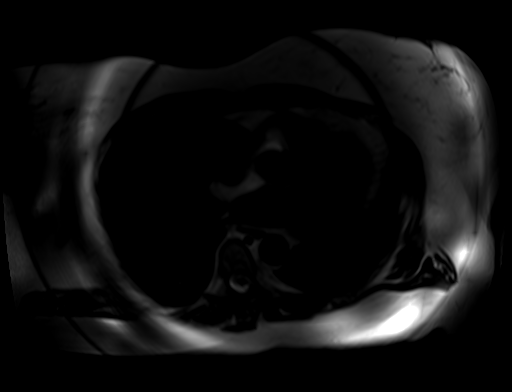

[Series 11: ep2d_diff_b50_500_800_p2 · axial · 6.0mm · 2.08mm/px · z∈[-142,+127]mm · 5 of 120 slices shown]
[im 1/120]
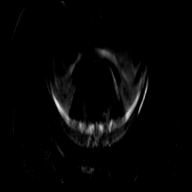
[im 30/120]
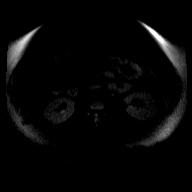
[im 60/120]
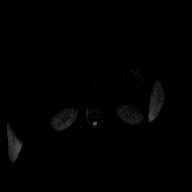
[im 90/120]
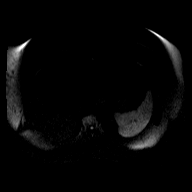
[im 120/120]
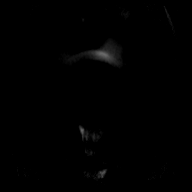

[Series 12: ep2d_diff_b50_500_800_p2_adc · axial · 6.0mm · 2.08mm/px · z∈[-142,+127]mm · 2 of 40 slices shown]
[im 1/40]
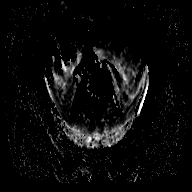
[im 40/40]
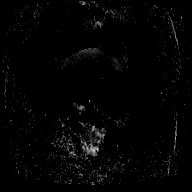

[Series 15: axial tru fisp · axial · 5.0mm · 1.56mm/px · 1 of 45 slices shown]
[im 1/45]
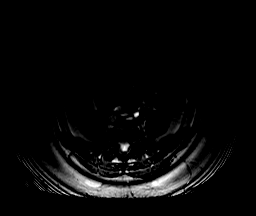

[Series 16: axial in out · axial · 6.0mm · 0.78mm/px · z∈[-179,+97]mm · 3 of 82 slices shown]
[im 1/82]
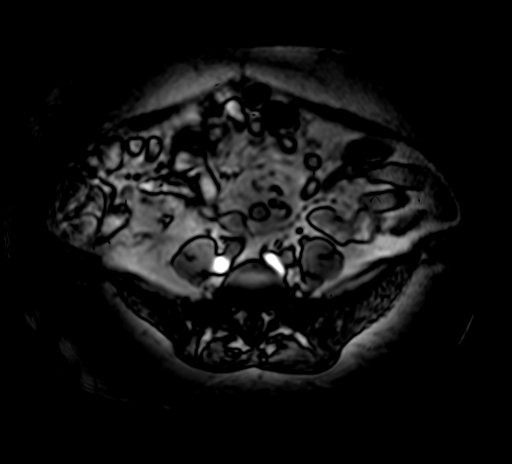
[im 41/82]
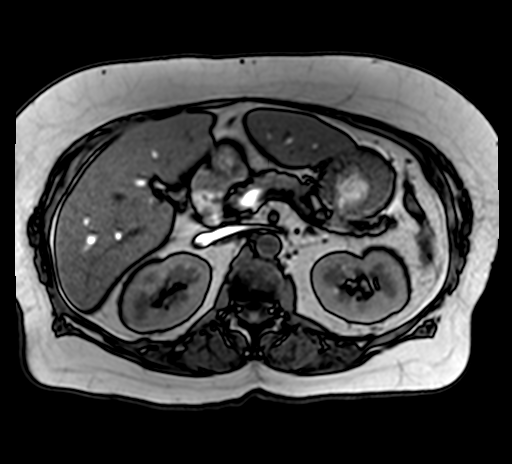
[im 82/82]
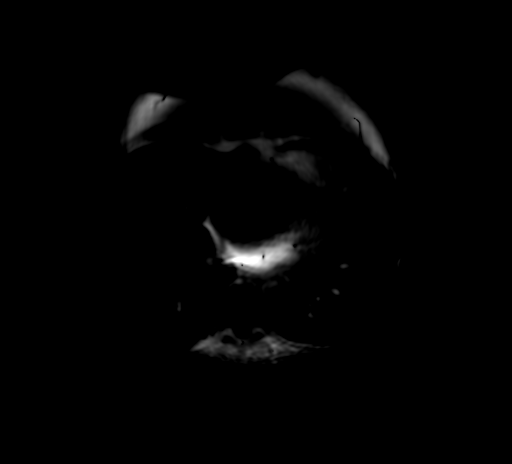

[Series 17: T1 dynamic · axial · non-contrast · 2.5mm · 0.78mm/px · z∈[-170,+88]mm · 3 of 104 slices shown]
[im 1/104]
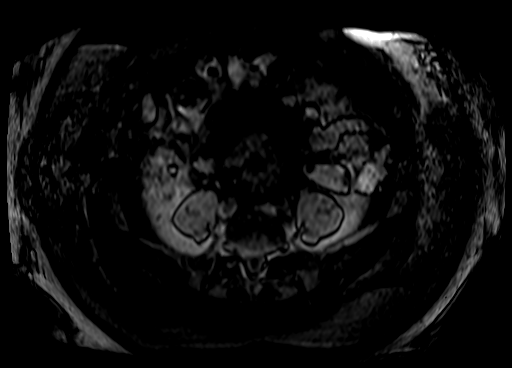
[im 52/104]
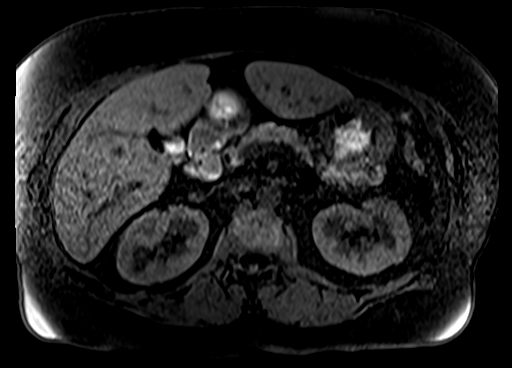
[im 104/104]
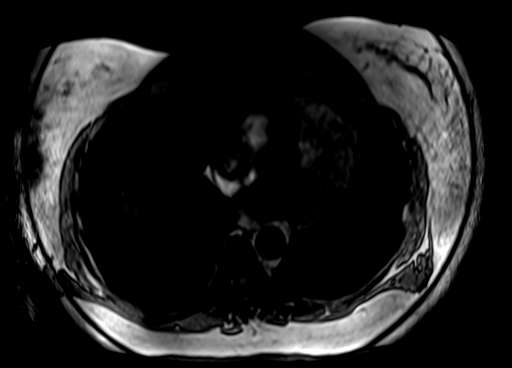

[Series 18: post 25 sec · axial · 2.5mm · 0.78mm/px · z∈[-170,+88]mm · 3 of 104 slices shown]
[im 1/104]
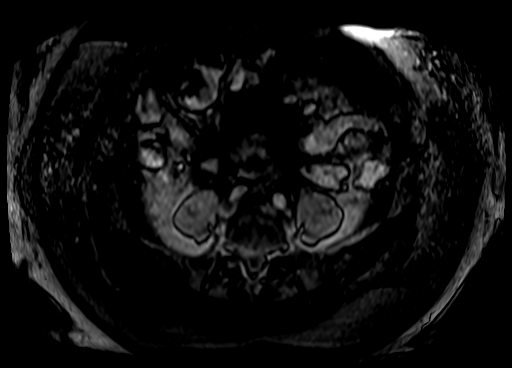
[im 52/104]
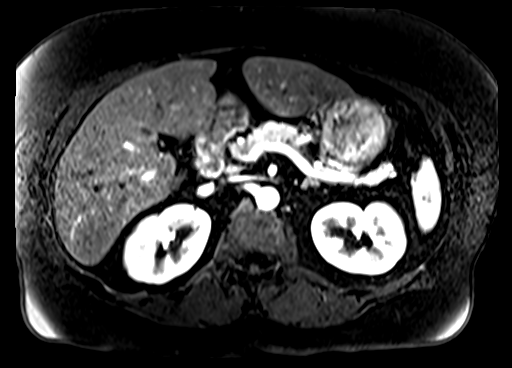
[im 104/104]
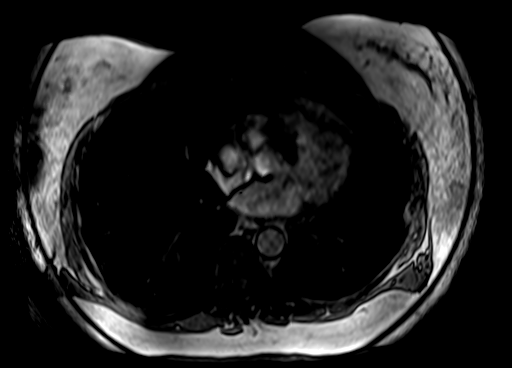

[Series 19: post 25 sec_sub · axial · 2.5mm · 0.78mm/px · z∈[-170,+88]mm · 3 of 104 slices shown]
[im 1/104]
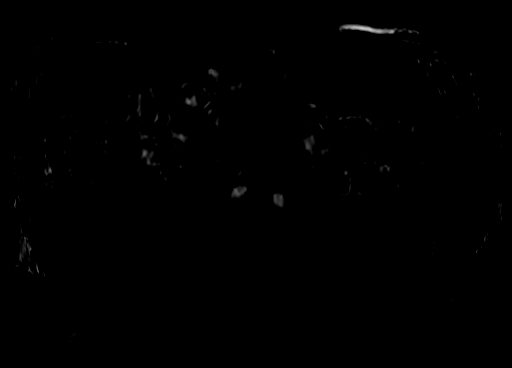
[im 52/104]
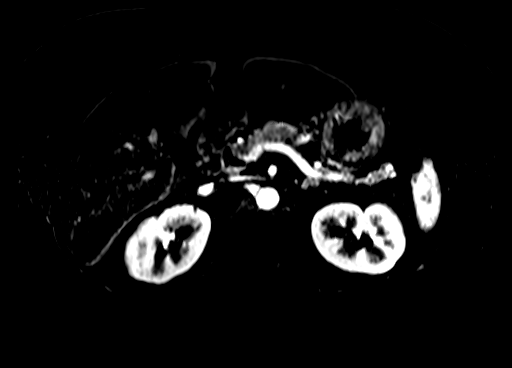
[im 104/104]
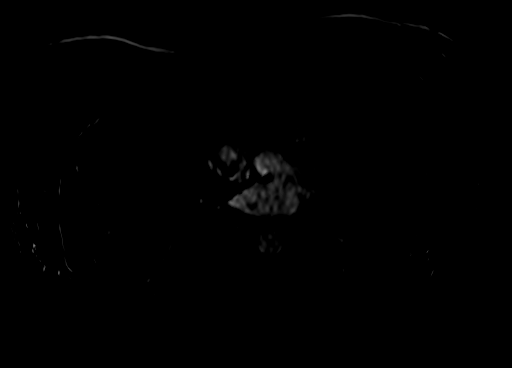

[Series 20: post 45 sec · axial · 2.5mm · 0.78mm/px · z∈[-170,+88]mm · 3 of 104 slices shown]
[im 1/104]
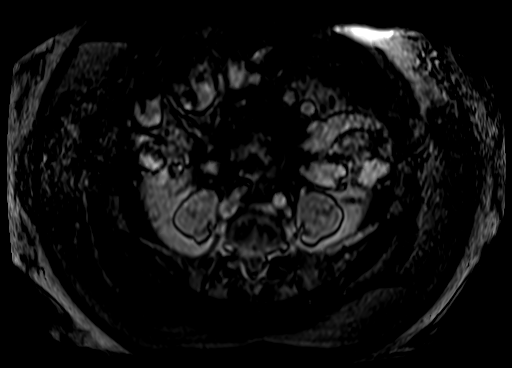
[im 52/104]
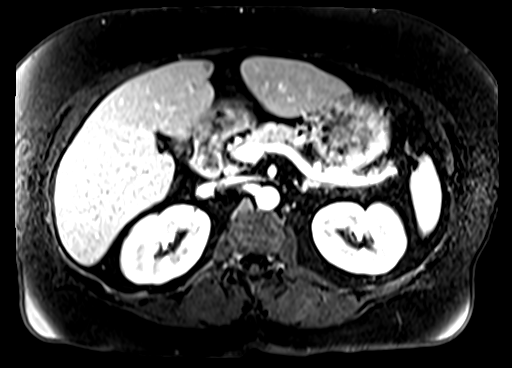
[im 104/104]
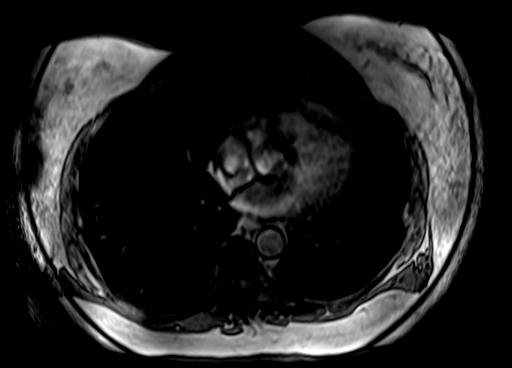

[Series 21: post 45 sec_sub · axial · 2.5mm · 0.78mm/px · z∈[-170,-42]mm · 2 of 104 slices shown]
[im 1/104]
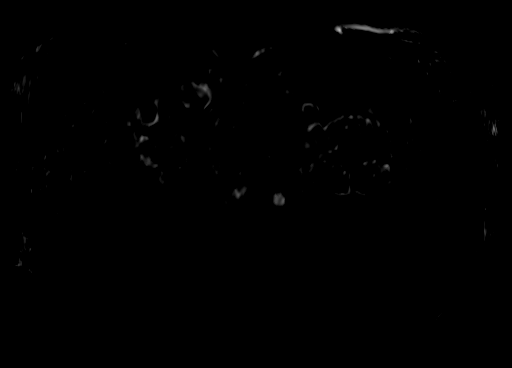
[im 52/104]
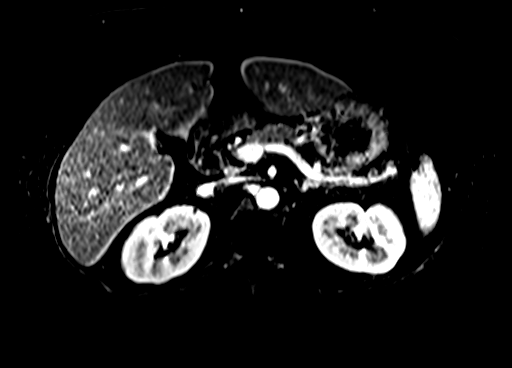

[31 of 48 positions shown; findings below may reference images not displayed]

FINDINGS: Lower chest: No acute abnormality.

Hepatobiliary: Mild hepatomegaly measuring 21.7 cm in maximum
craniocaudal dimension. Mild diffuse hepatic steatosis with focal
fatty sparing along the gallbladder fossa.

Unchanged size of the arterially enhancing segment VII hepatic
lesion which is intrinsically isointense to background hepatic
parenchyma on non contrasted T1 and T2 sequences but demonstrate
diffuse avid enhancement on early arterial post-contrast phase
measuring 13 mm on image 33/18. Lesion demonstrates persistent
postcontrast enhancement, slightly greater than that background
hepatic parenchyma but less than that of blood pool as seen on 3
minutes postcontrast sequence image 38/25. The additional smaller
hypervascular lesion with similar imaging characteristics are also,
in the right hepatic lobe on image 21/18, 51/18 and in the left
hepatic lobe on image 38/18. Additional lesion with similar imaging
characteristics in the left lobe of the liver on image 29/18, which
in retrospect was subtly evident on prior imaging, and also appears
unchanged.

No new suspicious hepatic lesions.

Gallbladder is surgically absent.  No biliary ductal dilation.

Pancreas: No pancreatic ductal dilation. Previously visualized 4 mm
T2 "white spot" in the tail the pancreas is not well seen on today's
examination. However, there is a new 3 mm T2 "white spot" in the
body of the pancreas on image [DATE]. No suspicious postcontrast
enhancement.

Spleen:  Within normal limits in size and appearance.

Adrenals/Urinary Tract: Bilateral adrenal glands are unremarkable.
Tiny bilateral renal cysts. No solid enhancing renal masses.

Stomach/Bowel: Visualized portions within the abdomen are
unremarkable.

Vascular/Lymphatic: No pathologically enlarged lymph nodes
identified. No abdominal aortic aneurysm demonstrated.

Other:  No abdominal ascites.

Musculoskeletal: No suspicious bone lesions identified.
IMPRESSION: 1. Hypervascular bilobar hepatic lesions are not significantly
changed, and given 2 years of stability are almost certainly benign,
likely representing focal nodular hyperplasia. However, if clinical
plan indicate continued imaging surveillance of these lesions, more
definitive characterization may be obtained with follow-up MRI with
and without EOVIST contrast.
2. Mild hepatomegaly and diffuse hepatic steatosis with focal fatty
sparing along the gallbladder fossa.
3. New 3 mm T2 "white spot" in the body of the pancreas. Ulcer
representing a benign etiology such as a small side branch IPMN.
Recommend follow up pre and post contrast MRI/MRCP or pancreatic
protocol CT in 2 years. However, if continued surveillance of the
hepatic lesion is plant, follow-up for the pancreatic lesions could
be obtained at that time. This recommendation follows ACR consensus
guidelines: Management of Incidental Pancreatic Cysts: A White Paper
of the ACR Incidental Findings Committee. [HOSPITAL]

## 2020-10-30 MED ORDER — GADOBENATE DIMEGLUMINE 529 MG/ML IV SOLN
19.0000 mL | Freq: Once | INTRAVENOUS | Status: AC | PRN
  Administered 2020-10-30: 19 mL via INTRAVENOUS

## 2020-11-05 ENCOUNTER — Ambulatory Visit: Payer: BC Managed Care – PPO

## 2020-11-10 ENCOUNTER — Ambulatory Visit (HOSPITAL_BASED_OUTPATIENT_CLINIC_OR_DEPARTMENT_OTHER): Payer: BC Managed Care – PPO | Admitting: Obstetrics & Gynecology

## 2021-01-18 ENCOUNTER — Ambulatory Visit (HOSPITAL_BASED_OUTPATIENT_CLINIC_OR_DEPARTMENT_OTHER): Payer: BC Managed Care – PPO | Admitting: Obstetrics & Gynecology

## 2021-01-19 ENCOUNTER — Encounter (HOSPITAL_BASED_OUTPATIENT_CLINIC_OR_DEPARTMENT_OTHER): Payer: Self-pay

## 2021-01-19 ENCOUNTER — Other Ambulatory Visit: Payer: Self-pay

## 2021-01-19 ENCOUNTER — Ambulatory Visit (INDEPENDENT_AMBULATORY_CARE_PROVIDER_SITE_OTHER): Payer: BC Managed Care – PPO | Admitting: Obstetrics & Gynecology

## 2021-01-19 ENCOUNTER — Encounter (HOSPITAL_BASED_OUTPATIENT_CLINIC_OR_DEPARTMENT_OTHER): Payer: Self-pay | Admitting: Obstetrics & Gynecology

## 2021-01-19 VITALS — BP 132/59 | HR 83 | Ht 65.5 in | Wt 208.6 lb

## 2021-01-19 DIAGNOSIS — Z794 Long term (current) use of insulin: Secondary | ICD-10-CM

## 2021-01-19 DIAGNOSIS — Z01419 Encounter for gynecological examination (general) (routine) without abnormal findings: Secondary | ICD-10-CM | POA: Diagnosis not present

## 2021-01-19 DIAGNOSIS — Z86718 Personal history of other venous thrombosis and embolism: Secondary | ICD-10-CM

## 2021-01-19 DIAGNOSIS — N9089 Other specified noninflammatory disorders of vulva and perineum: Secondary | ICD-10-CM

## 2021-01-19 DIAGNOSIS — E119 Type 2 diabetes mellitus without complications: Secondary | ICD-10-CM

## 2021-01-19 DIAGNOSIS — Z780 Asymptomatic menopausal state: Secondary | ICD-10-CM | POA: Diagnosis not present

## 2021-01-19 MED ORDER — TRIAMCINOLONE ACETONIDE 0.5 % EX OINT: 1.0000 "application " | TOPICAL_OINTMENT | Freq: Two times a day (BID) | CUTANEOUS | 0 refills | Status: DC
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
## 2021-08-25 ENCOUNTER — Encounter (HOSPITAL_BASED_OUTPATIENT_CLINIC_OR_DEPARTMENT_OTHER): Payer: Self-pay | Admitting: Obstetrics & Gynecology

## 2021-08-26 ENCOUNTER — Other Ambulatory Visit (HOSPITAL_BASED_OUTPATIENT_CLINIC_OR_DEPARTMENT_OTHER): Payer: Self-pay | Admitting: Obstetrics & Gynecology

## 2021-08-26 MED ORDER — NYSTATIN 100000 UNIT/GM EX POWD: 1.0000 "application " | Freq: Two times a day (BID) | CUTANEOUS | 2 refills | Status: AC

## 2021-11-24 DIAGNOSIS — E1165 Type 2 diabetes mellitus with hyperglycemia: Secondary | ICD-10-CM | POA: Diagnosis not present

## 2021-12-05 DIAGNOSIS — H15001 Unspecified scleritis, right eye: Secondary | ICD-10-CM | POA: Diagnosis not present

## 2021-12-20 DIAGNOSIS — E785 Hyperlipidemia, unspecified: Secondary | ICD-10-CM | POA: Diagnosis not present

## 2021-12-20 DIAGNOSIS — R5383 Other fatigue: Secondary | ICD-10-CM | POA: Diagnosis not present

## 2021-12-20 DIAGNOSIS — F419 Anxiety disorder, unspecified: Secondary | ICD-10-CM | POA: Diagnosis not present

## 2021-12-20 DIAGNOSIS — I10 Essential (primary) hypertension: Secondary | ICD-10-CM | POA: Diagnosis not present

## 2021-12-20 DIAGNOSIS — E039 Hypothyroidism, unspecified: Secondary | ICD-10-CM | POA: Diagnosis not present

## 2021-12-20 DIAGNOSIS — E559 Vitamin D deficiency, unspecified: Secondary | ICD-10-CM | POA: Diagnosis not present

## 2021-12-20 DIAGNOSIS — E1140 Type 2 diabetes mellitus with diabetic neuropathy, unspecified: Secondary | ICD-10-CM | POA: Diagnosis not present

## 2021-12-20 DIAGNOSIS — M109 Gout, unspecified: Secondary | ICD-10-CM | POA: Diagnosis not present

## 2021-12-27 DIAGNOSIS — Z0000 Encounter for general adult medical examination without abnormal findings: Secondary | ICD-10-CM | POA: Diagnosis not present

## 2021-12-27 DIAGNOSIS — E1140 Type 2 diabetes mellitus with diabetic neuropathy, unspecified: Secondary | ICD-10-CM | POA: Diagnosis not present

## 2021-12-27 DIAGNOSIS — Z794 Long term (current) use of insulin: Secondary | ICD-10-CM | POA: Diagnosis not present

## 2021-12-27 DIAGNOSIS — G629 Polyneuropathy, unspecified: Secondary | ICD-10-CM | POA: Diagnosis not present

## 2021-12-27 DIAGNOSIS — E1165 Type 2 diabetes mellitus with hyperglycemia: Secondary | ICD-10-CM | POA: Diagnosis not present

## 2021-12-27 DIAGNOSIS — E669 Obesity, unspecified: Secondary | ICD-10-CM | POA: Diagnosis not present

## 2021-12-27 DIAGNOSIS — E785 Hyperlipidemia, unspecified: Secondary | ICD-10-CM | POA: Diagnosis not present

## 2021-12-27 DIAGNOSIS — R82998 Other abnormal findings in urine: Secondary | ICD-10-CM | POA: Diagnosis not present

## 2021-12-27 DIAGNOSIS — I10 Essential (primary) hypertension: Secondary | ICD-10-CM | POA: Diagnosis not present

## 2021-12-27 DIAGNOSIS — E039 Hypothyroidism, unspecified: Secondary | ICD-10-CM | POA: Diagnosis not present

## 2022-01-02 DIAGNOSIS — H15009 Unspecified scleritis, unspecified eye: Secondary | ICD-10-CM | POA: Diagnosis not present

## 2022-01-02 DIAGNOSIS — R0789 Other chest pain: Secondary | ICD-10-CM | POA: Diagnosis not present

## 2022-01-28 DIAGNOSIS — Z23 Encounter for immunization: Secondary | ICD-10-CM | POA: Diagnosis not present

## 2022-02-22 DIAGNOSIS — E1165 Type 2 diabetes mellitus with hyperglycemia: Secondary | ICD-10-CM | POA: Diagnosis not present

## 2022-04-03 DIAGNOSIS — H25813 Combined forms of age-related cataract, bilateral: Secondary | ICD-10-CM | POA: Diagnosis not present

## 2022-04-03 DIAGNOSIS — H15001 Unspecified scleritis, right eye: Secondary | ICD-10-CM | POA: Diagnosis not present

## 2022-04-27 DIAGNOSIS — H25811 Combined forms of age-related cataract, right eye: Secondary | ICD-10-CM | POA: Diagnosis not present

## 2022-04-27 DIAGNOSIS — H269 Unspecified cataract: Secondary | ICD-10-CM | POA: Diagnosis not present

## 2022-04-27 DIAGNOSIS — H2511 Age-related nuclear cataract, right eye: Secondary | ICD-10-CM | POA: Diagnosis not present

## 2022-05-25 DIAGNOSIS — E1165 Type 2 diabetes mellitus with hyperglycemia: Secondary | ICD-10-CM | POA: Diagnosis not present

## 2022-05-26 DIAGNOSIS — Z794 Long term (current) use of insulin: Secondary | ICD-10-CM | POA: Diagnosis not present

## 2022-05-26 DIAGNOSIS — F419 Anxiety disorder, unspecified: Secondary | ICD-10-CM | POA: Diagnosis not present

## 2022-05-26 DIAGNOSIS — E039 Hypothyroidism, unspecified: Secondary | ICD-10-CM | POA: Diagnosis not present

## 2022-05-26 DIAGNOSIS — I10 Essential (primary) hypertension: Secondary | ICD-10-CM | POA: Diagnosis not present

## 2022-05-26 DIAGNOSIS — G629 Polyneuropathy, unspecified: Secondary | ICD-10-CM | POA: Diagnosis not present

## 2022-05-26 DIAGNOSIS — E785 Hyperlipidemia, unspecified: Secondary | ICD-10-CM | POA: Diagnosis not present

## 2022-05-26 DIAGNOSIS — E1140 Type 2 diabetes mellitus with diabetic neuropathy, unspecified: Secondary | ICD-10-CM | POA: Diagnosis not present

## 2022-05-26 DIAGNOSIS — E669 Obesity, unspecified: Secondary | ICD-10-CM | POA: Diagnosis not present

## 2022-05-26 DIAGNOSIS — E1165 Type 2 diabetes mellitus with hyperglycemia: Secondary | ICD-10-CM | POA: Diagnosis not present

## 2022-06-05 DIAGNOSIS — H25811 Combined forms of age-related cataract, right eye: Secondary | ICD-10-CM | POA: Diagnosis not present

## 2022-06-19 DIAGNOSIS — H9313 Tinnitus, bilateral: Secondary | ICD-10-CM | POA: Diagnosis not present

## 2022-06-19 DIAGNOSIS — H903 Sensorineural hearing loss, bilateral: Secondary | ICD-10-CM | POA: Diagnosis not present

## 2022-06-19 DIAGNOSIS — R040 Epistaxis: Secondary | ICD-10-CM | POA: Diagnosis not present

## 2022-07-11 ENCOUNTER — Telehealth (HOSPITAL_BASED_OUTPATIENT_CLINIC_OR_DEPARTMENT_OTHER): Payer: Self-pay | Admitting: *Deleted

## 2022-07-11 NOTE — Telephone Encounter (Signed)
Pt asked for refill on the Kenalog cream you prescribe for her.  She doesn't use the cream often and her symptoms aren't too severe when she does use it.  The cream she currently has is expired.  Please advise on refill.  Pt will be called to set up annual exam. KW CMA

## 2022-07-12 ENCOUNTER — Other Ambulatory Visit (HOSPITAL_BASED_OUTPATIENT_CLINIC_OR_DEPARTMENT_OTHER): Payer: Self-pay | Admitting: Obstetrics & Gynecology

## 2022-07-12 DIAGNOSIS — N9089 Other specified noninflammatory disorders of vulva and perineum: Secondary | ICD-10-CM

## 2022-07-12 MED ORDER — TRIAMCINOLONE ACETONIDE 0.5 % EX OINT: 1.0000 | TOPICAL_OINTMENT | Freq: Two times a day (BID) | CUTANEOUS | 1 refills | Status: AC

## 2022-08-29 DIAGNOSIS — Z794 Long term (current) use of insulin: Secondary | ICD-10-CM | POA: Diagnosis not present

## 2022-08-29 DIAGNOSIS — E039 Hypothyroidism, unspecified: Secondary | ICD-10-CM | POA: Diagnosis not present

## 2022-08-29 DIAGNOSIS — I10 Essential (primary) hypertension: Secondary | ICD-10-CM | POA: Diagnosis not present

## 2022-08-29 DIAGNOSIS — F419 Anxiety disorder, unspecified: Secondary | ICD-10-CM | POA: Diagnosis not present

## 2022-08-29 DIAGNOSIS — E785 Hyperlipidemia, unspecified: Secondary | ICD-10-CM | POA: Diagnosis not present

## 2022-08-29 DIAGNOSIS — E669 Obesity, unspecified: Secondary | ICD-10-CM | POA: Diagnosis not present

## 2022-08-29 DIAGNOSIS — E1165 Type 2 diabetes mellitus with hyperglycemia: Secondary | ICD-10-CM | POA: Diagnosis not present

## 2022-08-29 DIAGNOSIS — G629 Polyneuropathy, unspecified: Secondary | ICD-10-CM | POA: Diagnosis not present

## 2022-08-29 DIAGNOSIS — E1140 Type 2 diabetes mellitus with diabetic neuropathy, unspecified: Secondary | ICD-10-CM | POA: Diagnosis not present

## 2022-09-05 DIAGNOSIS — Z1231 Encounter for screening mammogram for malignant neoplasm of breast: Secondary | ICD-10-CM | POA: Diagnosis not present

## 2022-09-12 DIAGNOSIS — R519 Headache, unspecified: Secondary | ICD-10-CM | POA: Diagnosis not present

## 2022-09-14 DIAGNOSIS — E1165 Type 2 diabetes mellitus with hyperglycemia: Secondary | ICD-10-CM | POA: Diagnosis not present

## 2022-12-12 ENCOUNTER — Ambulatory Visit: Payer: Medicare PPO | Admitting: Psychiatry

## 2022-12-14 DIAGNOSIS — E1165 Type 2 diabetes mellitus with hyperglycemia: Secondary | ICD-10-CM | POA: Diagnosis not present

## 2022-12-20 ENCOUNTER — Encounter: Payer: Self-pay | Admitting: Psychiatry

## 2022-12-20 ENCOUNTER — Ambulatory Visit: Payer: Medicare PPO | Admitting: Psychiatry

## 2022-12-20 VITALS — BP 139/78 | HR 83 | Ht 65.5 in | Wt 210.0 lb

## 2022-12-20 DIAGNOSIS — M542 Cervicalgia: Secondary | ICD-10-CM

## 2022-12-20 DIAGNOSIS — M5481 Occipital neuralgia: Secondary | ICD-10-CM

## 2022-12-20 MED ORDER — GABAPENTIN 300 MG PO CAPS: 300.0000 mg | ORAL_CAPSULE | Freq: Every day | ORAL | 6 refills | Status: AC

## 2022-12-20 NOTE — Patient Instructions (Addendum)
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 Pain is often described as a sharp, shooting pain at the back of the head which travels to the scalp and behind the eye. Other symptoms include tenderness at the base of the head, neck pain, and a sensation of numbness, tingling, or crawling on the scalp. It is often associated with neck tension. When neck muscles are tight they can compress the nerve and cause irritation.   Treatment options include physical therapy for neck pain and tightness. We can also use medications for nerve pain as well as muscle relaxers. We can also perform an occipital nerve block, which is an injection of steroids and a numbing medication in the back of the head. This can be performed every 3 months.  ---  Tasks to improve attention/working memory 1. Good sleep hygiene (7-8 hrs of sleep) 2. Learning a new skill (Painting, Carpentry, Pottery, new language, Knitting). 3.Cognitive exercises (keep a daily journal, Puzzles) 4. Physical exercise and training  (30 min/day X 4 days week) 5. Being on Antidepressant if needed 6.Yoga, Meditation, Tai Chi 7. Decrease alcohol intake 8.Have a clear schedule and structure in daily routine  MIND Diet: The Mediterranean-DASH Diet Intervention for Neurodegenerative Delay, or MIND diet, targets the health of the aging brain. Research participants with the highest MIND diet scores had a significantly slower rate of cognitive decline compared with those with the lowest scores. The effects of the MIND diet on cognition showed greater effects than either the Mediterranean or the DASH diet alone.  The healthy items the MIND diet guidelines suggest include:  3+ servings a day of whole grains 1+ servings a day of vegetables (other than green leafy) 6+ servings a week of green leafy  vegetables 5+ servings a week of nuts 4+ meals a week of beans 2+ servings a week of berries 2+ meals a week of poultry 1+ meals a week of fish Mainly olive oil if added fat is used  The unhealthy items, which are higher in saturated and trans fat, include: Less than 5 servings a week of pastries and sweets Less than 4 servings a week of red meat (including beef, pork, lamb, and products made from these meats) Less than one serving a week of cheese and fried foods Less than 1 tablespoon a day of butter/stick margarine
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
## 2022-12-27 ENCOUNTER — Other Ambulatory Visit: Payer: Self-pay

## 2022-12-27 ENCOUNTER — Ambulatory Visit: Payer: Medicare PPO | Attending: Psychiatry | Admitting: Physical Therapy

## 2022-12-27 ENCOUNTER — Encounter: Payer: Self-pay | Admitting: Physical Therapy

## 2022-12-27 VITALS — BP 134/71 | HR 86

## 2022-12-27 DIAGNOSIS — M542 Cervicalgia: Secondary | ICD-10-CM | POA: Diagnosis not present
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
## 2023-01-05 ENCOUNTER — Encounter: Payer: Self-pay | Admitting: Physical Therapy

## 2023-01-05 ENCOUNTER — Ambulatory Visit: Payer: Medicare PPO | Admitting: Physical Therapy

## 2023-01-05 DIAGNOSIS — M542 Cervicalgia: Secondary | ICD-10-CM

## 2023-01-05 NOTE — Patient Instructions (Signed)
Access Code: [email or web address] URL: [email or web address] Date: 01/05/2023 Prepared by: Camille Bal  Exercises - Supine Chin Tuck  - 1 x daily - 5 x weekly - 2 sets - 12 reps - Supine Cervical Retraction with Towel  - 1 x daily - 5 x weekly - 2 sets - 15 reps - Supine Isometric Neck Rotation  - 1 x daily - 5 x weekly - 2 sets - 10 reps - 1-2 seconds hold - Seated Upper Trapezius Stretch  - 1 x daily - 5 x weekly - 1 sets - 3 reps - 40 seconds hold

## 2023-01-05 NOTE — Therapy (Signed)
OUTPATIENT PHYSICAL THERAPY CERVICAL TREATMENT   Patient Name: [name] MRN: [id number] DOB:[birth year], 67 y.o., female Today's Date: 01/05/2023  END OF SESSION:  PT End of Session - 01/05/23 1451     Visit Number 2    Number of Visits 7   6 + eval   Date for PT Re-Evaluation 02/16/23   pushed out in event of scheduling conflict   Authorization Type [employer]ICARE    Progress Note Due on Visit 10    PT Start Time 1446    PT Stop Time 1530    PT Time Calculation (min) 44 min    Activity Tolerance Patient tolerated treatment well    Behavior During Therapy WFL for tasks assessed/performed            Past Medical History:  Diagnosis Date   Anxiety    Asthma    Diabetes mellitus, type II, insulin dependent (HCC)    On Jardiance, metformin and NovoLog sliding scale plus Tresiba   DVT (deep venous thrombosis) (HCC) 1980   Dyslipidemia    Hyperlipidemia, with hypertriglyceridemia-on fenofibrate and Lovaza   GERD (gastroesophageal reflux disease)    History of broken collarbone    Hypertension    Hypothyroidism    On pretty high-dose of Synthroid   Liver hemangioma    right lobe   Nephrolithiasis    Osteoarthritis    Panic disorder    Rosacea    Past Surgical History:  Procedure Laterality Date   BREAST DUCTAL SYSTEM EXCISION Left 11/1997   CATARACT EXTRACTION Right    04/2022   CHOLECYSTECTOMY  2005   COMBINED HYSTEROSCOPY DIAGNOSTIC / D&C  1998   EXCISION / CURETTAGE BONE CYST TARSAL / METATARSAL Left 04/2020   excision of squamous cell Left 12/10/2016   Dr. Sharyn Lull   INCONTINENCE SURGERY  1999   KIDNEY STONE SURGERY     Extraction   KNEE ARTHROSCOPY Left 03/2001   KNEE ARTHROSCOPY Right 03/2002   OTHER SURGICAL HISTORY  02/2012   Rotator Cuff Sugery   REPLACEMENT TOTAL KNEE Left 08/2009   REPLACEMENT TOTAL KNEE Right 02/2011   ROTATOR CUFF REPAIR Right 02/2012   THYROIDECTOMY, PARTIAL Right 1994   THYROIDECTOMY, PARTIAL Left 1996   TOTAL  ABDOMINAL HYSTERECTOMY  2002   ovaries remain   Patient Active Problem List   Diagnosis Date Noted   Chest pain with moderate risk for cardiac etiology 01/15/2019   Abnormal finding on EKG 01/15/2019   Fatty liver disease, nonalcoholic 01/15/2019   Hypertension 02/02/2017   History of DVT (deep vein thrombosis) 02/02/2017   Asthma    Hypertriglyceridemia    Panic disorder    Type 2 diabetes mellitus without complication, with long-term current use of insulin (HCC) 09/15/2014   Hypothyroidism 09/15/2014    PCP: Creola Corn, MD  REFERRING PROVIDER: Ocie Doyne, MD  REFERRING DIAG: M54.2 (ICD-10-CM) - Cervicalgia  THERAPY DIAG:  Cervicalgia  Rationale for Evaluation and Treatment: Rehabilitation  ONSET DATE: April 2024  SUBJECTIVE:  SUBJECTIVE STATEMENT: Reports no headaches since last visit, but was very sore after evaluation.  Her pain is a 6/10 today in the back of the head. Hand dominance: Right  PERTINENT HISTORY:  DM2, HTN   PAIN:  Are you having pain? Yes: NPRS scale: 6/10 Pain location: back of head Pain description: soreness Aggravating factors: laying down, pressing on it Relieving factors: mornings  PRECAUTIONS: Fall  RED FLAGS: None     WEIGHT BEARING RESTRICTIONS: No  FALLS:  Has patient fallen in last 6 months? No  LIVING ENVIRONMENT: Lives with: lives with their spouse Lives in: House/apartment Stairs: Yes: Internal: 1 flight - pt lives on first floor steps; can reach both and External: 5 steps; can reach both Has following equipment at home: Single point cane, Environmental consultant - 2 wheeled, Tour manager, and Grab bars  OCCUPATION: Retired Surveyor, mining for [employer]  PLOF:  Sometimes needs help w/ grip due to severe hand  OA, otherwise independent  PATIENT GOALS: "I just want to be able to go to bed and not have a headache."  NEXT MD VISIT: Huston Foley, MD (neurology) 06/21/2023  OBJECTIVE:   DIAGNOSTIC FINDINGS:  No relevant imaging and none ordered at this time.  PATIENT SURVEYS:  NDI 8/50; low perceived disability  COGNITION: Overall cognitive status: Within functional limits for tasks assessed  SENSATION: Light touch: WFL  POSTURE: rounded shoulders, forward head, and increased thoracic kyphosis  PALPATION: Tender at stepoff of cervicothoracic junction, general upper trap tightness and mild hypertonicity noted at cervical paraspinal insertion, not tender in suboccipital region - possible atrophy noted   CERVICAL ROM:   Active ROM A/PROM (deg) eval  Flexion 48  Extension 44; pt notes the worst pain of all ROM  Right lateral flexion 21  Left lateral flexion 30  Right rotation 49  Left rotation 34; more pain than right   (Blank rows = not tested)  UPPER EXTREMITY ROM:  Active ROM Right eval Left eval  Shoulder flexion WNL WNL  Shoulder extension " "  Shoulder abduction " "  Shoulder adduction " "  Shoulder extension    Shoulder internal rotation " "  Shoulder external rotation " "  Elbow flexion    Elbow extension    Wrist flexion    Wrist extension    Wrist ulnar deviation    Wrist radial deviation    Wrist pronation    Wrist supination     (Blank rows = not tested)  UPPER EXTREMITY MMT:  MMT Right eval Left eval  Shoulder flexion 4/5 4/5  Shoulder extension    Shoulder abduction 4+/5 4/5  Shoulder adduction    Shoulder extension    Shoulder internal rotation    Shoulder external rotation    Middle trapezius    Lower trapezius    Elbow flexion    Elbow extension    Wrist flexion    Wrist extension    Wrist ulnar deviation    Wrist radial deviation    Wrist pronation    Wrist supination    Grip strength 44.9 lbs (max) 29.1 lbs (max)   (Blank rows = not  tested)  CERVICAL SPECIAL TESTS:  Neck flexor muscle endurance test: 8.34 seconds, no significant pain with positioning  FUNCTIONAL TESTS:  None relevant to chief complaint.  TODAY'S TREATMENT:  DATE: 01/05/2023 -Time spent adjusting pt schedule as needed. -Manual therapy/IASTM to bilateral cervical paraspinals, upper traps, and rhomboids.  Time spent for setup and clean up. -Initiated HEP: Access Code: [email or web address] URL: [email or web address] Date: 01/05/2023 Prepared by: Camille Bal  Exercises - Supine Chin Tuck  - 1 x daily - 5 x weekly - 2 sets - 12 reps - Supine Cervical Retraction with Towel  - 1 x daily - 5 x weekly - 2 sets - 15 reps - Supine Isometric Neck Rotation  - 1 x daily - 5 x weekly - 2 sets - 10 reps - 1-2 seconds hold - Seated Upper Trapezius Stretch  - 1 x daily - 5 x weekly - 1 sets - 3 reps - 40 seconds hold  PATIENT EDUCATION:  Education details:  Person educated: Patient Education method: Explanation Education comprehension: verbalized understanding  HOME EXERCISE PROGRAM: Access Code: [email or web address] URL: [email or web address] Date: 01/05/2023 Prepared by: Camille Bal  Exercises - Supine Chin Tuck  - 1 x daily - 5 x weekly - 2 sets - 12 reps - Supine Cervical Retraction with Towel  - 1 x daily - 5 x weekly - 2 sets - 15 reps - Supine Isometric Neck Rotation  - 1 x daily - 5 x weekly - 2 sets - 10 reps - 1-2 seconds hold - Seated Upper Trapezius Stretch  - 1 x daily - 5 x weekly - 1 sets - 3 reps - 40 seconds hold  ASSESSMENT:  CLINICAL IMPRESSION: Initiated IASTM for upper trunk and cervical musculature with some positive response.  Patient is open to dry needling so schedule modified to accommodate this modality.  Initiated a conservative cervical mobility focused HEP to re-establish comfortable  movement patterns and function.  Will continue POC.  OBJECTIVE IMPAIRMENTS: decreased activity tolerance, decreased ROM, decreased strength, hypomobility, impaired flexibility, improper body mechanics, postural dysfunction, and pain.   ACTIVITY LIMITATIONS: carrying, lifting, and reach over head  PARTICIPATION LIMITATIONS: driving, shopping, and community activity  PERSONAL FACTORS: Age, Fitness, Time since onset of injury/illness/exacerbation, and 1-2 comorbidities: DM2, HTN  are also affecting patient's functional outcome.   REHAB POTENTIAL: Good  CLINICAL DECISION MAKING: Stable/uncomplicated  EVALUATION COMPLEXITY: Low   GOALS: Goals reviewed with patient? Yes  SHORT TERM GOALS: Target date: 01/19/2023  Pt will be independent and compliant with initial cervical mobility and strength focused HEP in order to maintain functional progress and improve mobility. Baseline: To be initiated. Goal status: INITIAL  LONG TERM GOALS: Target date: 02/09/2023  Pt will be independent and compliant with finalized cervical mobility and strength focused HEP in order to maintain functional progress and improve mobility. Baseline: To be initiated. Goal status: INITIAL  2.  Patient will report no more than 2-4 headaches per month in order to demonstrate improved quality of life and pain management. Baseline:  1-2 headaches per week Goal status: INITIAL  3.  Pt will report an NDI score of no more than 4/50 in order to demonstrate improved cervical function and pain management. Baseline: 8/50 Goal status: INITIAL  4.  Pt will improve neck flexor endurance test to >/=15 seconds to demonstrate improved cervical functioning. Baseline: 8.34 seconds Goal status: INITIAL  5.  Pt will improve left cervical rotation to >/=40 degrees in order to improve symmetry of mobility for ADLs and driving. Baseline: 34 degrees Goal status: INITIAL  PLAN:  PT FREQUENCY: 1x/week  PT DURATION: 6  weeks  PLANNED INTERVENTIONS: Therapeutic exercises, Therapeutic activity, Neuromuscular re-education, Balance training, Gait  training, Patient/Family education, Self Care, Joint mobilization, Vestibular training, Dry Needling, Electrical stimulation, Spinal mobilization, Moist heat, Taping, Manual therapy, and Re-evaluation  PLAN FOR NEXT SESSION: Add to cervical stretching/strengthening and self mobilization HEP-introduce chirp wheel vs tennis/spike ball.  Periscapular stability.  Try manual therapy/IASTM again?  TPDN.  Sadie Haber, PT, DPT 01/05/2023, 4:17 PM

## 2023-01-08 DIAGNOSIS — Z1212 Encounter for screening for malignant neoplasm of rectum: Secondary | ICD-10-CM | POA: Diagnosis not present

## 2023-01-08 DIAGNOSIS — E559 Vitamin D deficiency, unspecified: Secondary | ICD-10-CM | POA: Diagnosis not present

## 2023-01-08 DIAGNOSIS — E538 Deficiency of other specified B group vitamins: Secondary | ICD-10-CM | POA: Diagnosis not present

## 2023-01-08 DIAGNOSIS — I10 Essential (primary) hypertension: Secondary | ICD-10-CM | POA: Diagnosis not present

## 2023-01-08 DIAGNOSIS — E785 Hyperlipidemia, unspecified: Secondary | ICD-10-CM | POA: Diagnosis not present

## 2023-01-08 DIAGNOSIS — E1165 Type 2 diabetes mellitus with hyperglycemia: Secondary | ICD-10-CM | POA: Diagnosis not present

## 2023-01-08 DIAGNOSIS — Z794 Long term (current) use of insulin: Secondary | ICD-10-CM | POA: Diagnosis not present

## 2023-01-08 DIAGNOSIS — M109 Gout, unspecified: Secondary | ICD-10-CM | POA: Diagnosis not present

## 2023-01-08 DIAGNOSIS — E039 Hypothyroidism, unspecified: Secondary | ICD-10-CM | POA: Diagnosis not present

## 2023-01-12 ENCOUNTER — Ambulatory Visit: Payer: Medicare PPO | Admitting: Physical Therapy

## 2023-01-15 DIAGNOSIS — E1140 Type 2 diabetes mellitus with diabetic neuropathy, unspecified: Secondary | ICD-10-CM | POA: Diagnosis not present

## 2023-01-15 DIAGNOSIS — G629 Polyneuropathy, unspecified: Secondary | ICD-10-CM | POA: Diagnosis not present

## 2023-01-15 DIAGNOSIS — Z0000 Encounter for general adult medical examination without abnormal findings: Secondary | ICD-10-CM | POA: Diagnosis not present

## 2023-01-15 DIAGNOSIS — E785 Hyperlipidemia, unspecified: Secondary | ICD-10-CM | POA: Diagnosis not present

## 2023-01-15 DIAGNOSIS — I10 Essential (primary) hypertension: Secondary | ICD-10-CM | POA: Diagnosis not present

## 2023-01-15 DIAGNOSIS — E039 Hypothyroidism, unspecified: Secondary | ICD-10-CM | POA: Diagnosis not present

## 2023-01-15 DIAGNOSIS — E669 Obesity, unspecified: Secondary | ICD-10-CM | POA: Diagnosis not present

## 2023-01-15 DIAGNOSIS — E1165 Type 2 diabetes mellitus with hyperglycemia: Secondary | ICD-10-CM | POA: Diagnosis not present

## 2023-01-15 DIAGNOSIS — N3090 Cystitis, unspecified without hematuria: Secondary | ICD-10-CM | POA: Diagnosis not present

## 2023-01-15 DIAGNOSIS — Z794 Long term (current) use of insulin: Secondary | ICD-10-CM | POA: Diagnosis not present

## 2023-01-15 DIAGNOSIS — Z23 Encounter for immunization: Secondary | ICD-10-CM | POA: Diagnosis not present

## 2023-01-15 DIAGNOSIS — R82998 Other abnormal findings in urine: Secondary | ICD-10-CM | POA: Diagnosis not present

## 2023-01-15 DIAGNOSIS — F419 Anxiety disorder, unspecified: Secondary | ICD-10-CM | POA: Diagnosis not present

## 2023-01-17 ENCOUNTER — Other Ambulatory Visit: Payer: Self-pay | Admitting: Internal Medicine

## 2023-01-17 DIAGNOSIS — D1803 Hemangioma of intra-abdominal structures: Secondary | ICD-10-CM

## 2023-01-19 ENCOUNTER — Ambulatory Visit: Payer: Medicare PPO | Admitting: Physical Therapy

## 2023-01-22 ENCOUNTER — Ambulatory Visit: Payer: Medicare PPO | Attending: Psychiatry | Admitting: Physical Therapy

## 2023-01-22 ENCOUNTER — Telehealth: Payer: Self-pay | Admitting: Physical Therapy

## 2023-01-22 NOTE — Telephone Encounter (Signed)
LVM for patient regarding request for PT to call pt back following request to cancel all appts and discharge from PT.  Will proceed with discharge and await return call.  Provided front office number.  Camille Bal, PT, DPT

## 2023-01-26 ENCOUNTER — Ambulatory Visit: Payer: Medicare PPO | Admitting: Physical Therapy

## 2023-01-29 ENCOUNTER — Ambulatory Visit: Payer: Medicare PPO | Admitting: Physical Therapy

## 2023-02-02 ENCOUNTER — Ambulatory Visit: Payer: Medicare PPO | Admitting: Physical Therapy

## 2023-02-09 ENCOUNTER — Ambulatory Visit: Payer: Medicare PPO | Admitting: Physical Therapy

## 2023-02-13 DIAGNOSIS — Z85828 Personal history of other malignant neoplasm of skin: Secondary | ICD-10-CM | POA: Diagnosis not present

## 2023-02-13 DIAGNOSIS — L814 Other melanin hyperpigmentation: Secondary | ICD-10-CM | POA: Diagnosis not present

## 2023-02-13 DIAGNOSIS — L408 Other psoriasis: Secondary | ICD-10-CM | POA: Diagnosis not present

## 2023-02-13 DIAGNOSIS — L658 Other specified nonscarring hair loss: Secondary | ICD-10-CM | POA: Diagnosis not present

## 2023-02-13 DIAGNOSIS — L304 Erythema intertrigo: Secondary | ICD-10-CM | POA: Diagnosis not present

## 2023-02-13 DIAGNOSIS — L578 Other skin changes due to chronic exposure to nonionizing radiation: Secondary | ICD-10-CM | POA: Diagnosis not present

## 2023-02-13 DIAGNOSIS — D225 Melanocytic nevi of trunk: Secondary | ICD-10-CM | POA: Diagnosis not present

## 2023-02-13 DIAGNOSIS — D1720 Benign lipomatous neoplasm of skin and subcutaneous tissue of unspecified limb: Secondary | ICD-10-CM | POA: Diagnosis not present

## 2023-02-13 DIAGNOSIS — L821 Other seborrheic keratosis: Secondary | ICD-10-CM | POA: Diagnosis not present

## 2023-02-25 ENCOUNTER — Ambulatory Visit
Admission: RE | Admit: 2023-02-25 | Discharge: 2023-02-25 | Disposition: A | Discharge status: Home / Self Care | Payer: Medicare PPO | Source: Ambulatory Visit | Attending: Internal Medicine | Admitting: Internal Medicine

## 2023-02-25 ENCOUNTER — Other Ambulatory Visit: Payer: Medicare PPO

## 2023-02-25 DIAGNOSIS — R160 Hepatomegaly, not elsewhere classified: Secondary | ICD-10-CM | POA: Diagnosis not present

## 2023-02-25 DIAGNOSIS — K862 Cyst of pancreas: Secondary | ICD-10-CM | POA: Diagnosis not present

## 2023-02-25 DIAGNOSIS — D1803 Hemangioma of intra-abdominal structures: Secondary | ICD-10-CM

## 2023-02-25 DIAGNOSIS — K7689 Other specified diseases of liver: Secondary | ICD-10-CM | POA: Diagnosis not present

## 2023-02-25 MED ORDER — GADOPICLENOL 0.5 MMOL/ML IV SOLN
10.0000 mL | Freq: Once | INTRAVENOUS | Status: AC | PRN
  Administered 2023-02-25: 10 mL via INTRAVENOUS

## 2023-03-08 DIAGNOSIS — D1720 Benign lipomatous neoplasm of skin and subcutaneous tissue of unspecified limb: Secondary | ICD-10-CM | POA: Diagnosis not present

## 2023-03-19 DIAGNOSIS — E1165 Type 2 diabetes mellitus with hyperglycemia: Secondary | ICD-10-CM | POA: Diagnosis not present

## 2023-03-22 DIAGNOSIS — E1165 Type 2 diabetes mellitus with hyperglycemia: Secondary | ICD-10-CM | POA: Diagnosis not present

## 2023-03-26 DIAGNOSIS — L299 Pruritus, unspecified: Secondary | ICD-10-CM | POA: Diagnosis not present

## 2023-03-26 DIAGNOSIS — L918 Other hypertrophic disorders of the skin: Secondary | ICD-10-CM | POA: Diagnosis not present

## 2023-03-26 DIAGNOSIS — L209 Atopic dermatitis, unspecified: Secondary | ICD-10-CM | POA: Diagnosis not present

## 2023-03-26 DIAGNOSIS — L821 Other seborrheic keratosis: Secondary | ICD-10-CM | POA: Diagnosis not present

## 2023-03-26 DIAGNOSIS — R234 Changes in skin texture: Secondary | ICD-10-CM | POA: Diagnosis not present

## 2023-04-16 DIAGNOSIS — H524 Presbyopia: Secondary | ICD-10-CM | POA: Diagnosis not present

## 2023-04-16 DIAGNOSIS — H52223 Regular astigmatism, bilateral: Secondary | ICD-10-CM | POA: Diagnosis not present

## 2023-04-16 DIAGNOSIS — E113293 Type 2 diabetes mellitus with mild nonproliferative diabetic retinopathy without macular edema, bilateral: Secondary | ICD-10-CM | POA: Diagnosis not present

## 2023-05-02 ENCOUNTER — Other Ambulatory Visit: Payer: Self-pay | Admitting: General Surgery

## 2023-05-02 DIAGNOSIS — D1722 Benign lipomatous neoplasm of skin and subcutaneous tissue of left arm: Secondary | ICD-10-CM | POA: Diagnosis not present

## 2023-05-03 LAB — SURGICAL PATHOLOGY

## 2023-05-14 DIAGNOSIS — G629 Polyneuropathy, unspecified: Secondary | ICD-10-CM | POA: Diagnosis not present

## 2023-05-14 DIAGNOSIS — Z794 Long term (current) use of insulin: Secondary | ICD-10-CM | POA: Diagnosis not present

## 2023-05-14 DIAGNOSIS — E039 Hypothyroidism, unspecified: Secondary | ICD-10-CM | POA: Diagnosis not present

## 2023-05-14 DIAGNOSIS — E669 Obesity, unspecified: Secondary | ICD-10-CM | POA: Diagnosis not present

## 2023-05-14 DIAGNOSIS — F419 Anxiety disorder, unspecified: Secondary | ICD-10-CM | POA: Diagnosis not present

## 2023-05-14 DIAGNOSIS — E1140 Type 2 diabetes mellitus with diabetic neuropathy, unspecified: Secondary | ICD-10-CM | POA: Diagnosis not present

## 2023-05-14 DIAGNOSIS — E785 Hyperlipidemia, unspecified: Secondary | ICD-10-CM | POA: Diagnosis not present

## 2023-05-14 DIAGNOSIS — I10 Essential (primary) hypertension: Secondary | ICD-10-CM | POA: Diagnosis not present

## 2023-05-22 DIAGNOSIS — E113293 Type 2 diabetes mellitus with mild nonproliferative diabetic retinopathy without macular edema, bilateral: Secondary | ICD-10-CM | POA: Diagnosis not present

## 2023-05-30 DIAGNOSIS — L309 Dermatitis, unspecified: Secondary | ICD-10-CM | POA: Diagnosis not present

## 2023-05-30 DIAGNOSIS — L304 Erythema intertrigo: Secondary | ICD-10-CM | POA: Diagnosis not present

## 2023-05-30 DIAGNOSIS — L299 Pruritus, unspecified: Secondary | ICD-10-CM | POA: Diagnosis not present

## 2023-05-30 DIAGNOSIS — L658 Other specified nonscarring hair loss: Secondary | ICD-10-CM | POA: Diagnosis not present

## 2023-06-11 DIAGNOSIS — H25042 Posterior subcapsular polar age-related cataract, left eye: Secondary | ICD-10-CM | POA: Diagnosis not present

## 2023-06-11 DIAGNOSIS — E113293 Type 2 diabetes mellitus with mild nonproliferative diabetic retinopathy without macular edema, bilateral: Secondary | ICD-10-CM | POA: Diagnosis not present

## 2023-06-11 DIAGNOSIS — H18413 Arcus senilis, bilateral: Secondary | ICD-10-CM | POA: Diagnosis not present

## 2023-06-11 DIAGNOSIS — H2512 Age-related nuclear cataract, left eye: Secondary | ICD-10-CM | POA: Diagnosis not present

## 2023-06-21 ENCOUNTER — Ambulatory Visit: Payer: Medicare PPO | Admitting: Neurology

## 2023-06-22 DIAGNOSIS — E1165 Type 2 diabetes mellitus with hyperglycemia: Secondary | ICD-10-CM | POA: Diagnosis not present

## 2023-07-12 DIAGNOSIS — Z794 Long term (current) use of insulin: Secondary | ICD-10-CM | POA: Diagnosis not present

## 2023-07-12 DIAGNOSIS — E785 Hyperlipidemia, unspecified: Secondary | ICD-10-CM | POA: Diagnosis not present

## 2023-07-12 DIAGNOSIS — I10 Essential (primary) hypertension: Secondary | ICD-10-CM | POA: Diagnosis not present

## 2023-07-12 DIAGNOSIS — E1140 Type 2 diabetes mellitus with diabetic neuropathy, unspecified: Secondary | ICD-10-CM | POA: Diagnosis not present

## 2023-07-12 DIAGNOSIS — E669 Obesity, unspecified: Secondary | ICD-10-CM | POA: Diagnosis not present

## 2023-07-12 DIAGNOSIS — K760 Fatty (change of) liver, not elsewhere classified: Secondary | ICD-10-CM | POA: Diagnosis not present

## 2023-08-07 DIAGNOSIS — H2512 Age-related nuclear cataract, left eye: Secondary | ICD-10-CM | POA: Diagnosis not present

## 2023-08-07 DIAGNOSIS — E1136 Type 2 diabetes mellitus with diabetic cataract: Secondary | ICD-10-CM | POA: Diagnosis not present

## 2023-08-07 DIAGNOSIS — E039 Hypothyroidism, unspecified: Secondary | ICD-10-CM | POA: Diagnosis not present

## 2023-09-07 DIAGNOSIS — Z1231 Encounter for screening mammogram for malignant neoplasm of breast: Secondary | ICD-10-CM | POA: Diagnosis not present

## 2023-09-20 DIAGNOSIS — G629 Polyneuropathy, unspecified: Secondary | ICD-10-CM | POA: Diagnosis not present

## 2023-09-20 DIAGNOSIS — I10 Essential (primary) hypertension: Secondary | ICD-10-CM | POA: Diagnosis not present

## 2023-09-20 DIAGNOSIS — E785 Hyperlipidemia, unspecified: Secondary | ICD-10-CM | POA: Diagnosis not present

## 2023-09-20 DIAGNOSIS — Z1389 Encounter for screening for other disorder: Secondary | ICD-10-CM | POA: Diagnosis not present

## 2023-09-20 DIAGNOSIS — E039 Hypothyroidism, unspecified: Secondary | ICD-10-CM | POA: Diagnosis not present

## 2023-09-20 DIAGNOSIS — E1165 Type 2 diabetes mellitus with hyperglycemia: Secondary | ICD-10-CM | POA: Diagnosis not present

## 2023-09-20 DIAGNOSIS — R5383 Other fatigue: Secondary | ICD-10-CM | POA: Diagnosis not present

## 2023-09-20 DIAGNOSIS — E669 Obesity, unspecified: Secondary | ICD-10-CM | POA: Diagnosis not present

## 2023-09-20 DIAGNOSIS — F419 Anxiety disorder, unspecified: Secondary | ICD-10-CM | POA: Diagnosis not present

## 2023-09-20 DIAGNOSIS — E1140 Type 2 diabetes mellitus with diabetic neuropathy, unspecified: Secondary | ICD-10-CM | POA: Diagnosis not present

## 2023-11-14 DIAGNOSIS — H26492 Other secondary cataract, left eye: Secondary | ICD-10-CM | POA: Diagnosis not present

## 2023-11-14 DIAGNOSIS — H18413 Arcus senilis, bilateral: Secondary | ICD-10-CM | POA: Diagnosis not present

## 2023-11-14 DIAGNOSIS — Z961 Presence of intraocular lens: Secondary | ICD-10-CM | POA: Diagnosis not present

## 2023-11-14 DIAGNOSIS — E113293 Type 2 diabetes mellitus with mild nonproliferative diabetic retinopathy without macular edema, bilateral: Secondary | ICD-10-CM | POA: Diagnosis not present

## 2023-11-21 DIAGNOSIS — H26492 Other secondary cataract, left eye: Secondary | ICD-10-CM | POA: Diagnosis not present

## 2023-12-10 DIAGNOSIS — H26491 Other secondary cataract, right eye: Secondary | ICD-10-CM | POA: Diagnosis not present

## 2023-12-19 DIAGNOSIS — E1165 Type 2 diabetes mellitus with hyperglycemia: Secondary | ICD-10-CM | POA: Diagnosis not present

## 2024-01-10 DIAGNOSIS — E559 Vitamin D deficiency, unspecified: Secondary | ICD-10-CM | POA: Diagnosis not present

## 2024-01-10 DIAGNOSIS — M109 Gout, unspecified: Secondary | ICD-10-CM | POA: Diagnosis not present

## 2024-01-10 DIAGNOSIS — E039 Hypothyroidism, unspecified: Secondary | ICD-10-CM | POA: Diagnosis not present

## 2024-01-10 DIAGNOSIS — I10 Essential (primary) hypertension: Secondary | ICD-10-CM | POA: Diagnosis not present

## 2024-01-10 DIAGNOSIS — E538 Deficiency of other specified B group vitamins: Secondary | ICD-10-CM | POA: Diagnosis not present

## 2024-01-10 DIAGNOSIS — E1140 Type 2 diabetes mellitus with diabetic neuropathy, unspecified: Secondary | ICD-10-CM | POA: Diagnosis not present

## 2024-01-10 DIAGNOSIS — Z1212 Encounter for screening for malignant neoplasm of rectum: Secondary | ICD-10-CM | POA: Diagnosis not present

## 2024-01-10 DIAGNOSIS — E7849 Other hyperlipidemia: Secondary | ICD-10-CM | POA: Diagnosis not present

## 2024-01-10 DIAGNOSIS — E785 Hyperlipidemia, unspecified: Secondary | ICD-10-CM | POA: Diagnosis not present

## 2024-01-16 DIAGNOSIS — L821 Other seborrheic keratosis: Secondary | ICD-10-CM | POA: Diagnosis not present

## 2024-01-16 DIAGNOSIS — L814 Other melanin hyperpigmentation: Secondary | ICD-10-CM | POA: Diagnosis not present

## 2024-01-16 DIAGNOSIS — L578 Other skin changes due to chronic exposure to nonionizing radiation: Secondary | ICD-10-CM | POA: Diagnosis not present

## 2024-01-16 DIAGNOSIS — L299 Pruritus, unspecified: Secondary | ICD-10-CM | POA: Diagnosis not present

## 2024-01-16 DIAGNOSIS — D225 Melanocytic nevi of trunk: Secondary | ICD-10-CM | POA: Diagnosis not present

## 2024-01-16 DIAGNOSIS — Z85828 Personal history of other malignant neoplasm of skin: Secondary | ICD-10-CM | POA: Diagnosis not present

## 2024-01-16 DIAGNOSIS — L658 Other specified nonscarring hair loss: Secondary | ICD-10-CM | POA: Diagnosis not present

## 2024-01-16 DIAGNOSIS — L304 Erythema intertrigo: Secondary | ICD-10-CM | POA: Diagnosis not present

## 2024-01-17 DIAGNOSIS — F419 Anxiety disorder, unspecified: Secondary | ICD-10-CM | POA: Diagnosis not present

## 2024-01-17 DIAGNOSIS — M109 Gout, unspecified: Secondary | ICD-10-CM | POA: Diagnosis not present

## 2024-01-17 DIAGNOSIS — E785 Hyperlipidemia, unspecified: Secondary | ICD-10-CM | POA: Diagnosis not present

## 2024-01-17 DIAGNOSIS — Z23 Encounter for immunization: Secondary | ICD-10-CM | POA: Diagnosis not present

## 2024-01-17 DIAGNOSIS — I10 Essential (primary) hypertension: Secondary | ICD-10-CM | POA: Diagnosis not present

## 2024-01-17 DIAGNOSIS — E039 Hypothyroidism, unspecified: Secondary | ICD-10-CM | POA: Diagnosis not present

## 2024-01-17 DIAGNOSIS — Z0000 Encounter for general adult medical examination without abnormal findings: Secondary | ICD-10-CM | POA: Diagnosis not present

## 2024-01-17 DIAGNOSIS — E669 Obesity, unspecified: Secondary | ICD-10-CM | POA: Diagnosis not present

## 2024-01-17 DIAGNOSIS — E1165 Type 2 diabetes mellitus with hyperglycemia: Secondary | ICD-10-CM | POA: Diagnosis not present

## 2024-01-17 DIAGNOSIS — R82998 Other abnormal findings in urine: Secondary | ICD-10-CM | POA: Diagnosis not present

## 2024-01-29 DIAGNOSIS — R3989 Other symptoms and signs involving the genitourinary system: Secondary | ICD-10-CM | POA: Diagnosis not present

## 2024-01-29 DIAGNOSIS — M549 Dorsalgia, unspecified: Secondary | ICD-10-CM | POA: Diagnosis not present

## 2024-01-29 DIAGNOSIS — R350 Frequency of micturition: Secondary | ICD-10-CM | POA: Diagnosis not present

## 2024-03-07 DIAGNOSIS — H16223 Keratoconjunctivitis sicca, not specified as Sjogren's, bilateral: Secondary | ICD-10-CM | POA: Diagnosis not present

## 2024-03-20 DIAGNOSIS — E1165 Type 2 diabetes mellitus with hyperglycemia: Secondary | ICD-10-CM | POA: Diagnosis not present
